# Patient Record
Sex: Male | Born: 1940
Health system: Southern US, Community
[De-identification: ages and names within clinical notes are randomized; demographics above are authoritative.]

## PROBLEM LIST (undated history)

## (undated) DIAGNOSIS — Z974 Presence of external hearing-aid: Secondary | ICD-10-CM

## (undated) DIAGNOSIS — N200 Calculus of kidney: Secondary | ICD-10-CM

## (undated) DIAGNOSIS — Z66 Do not resuscitate: Secondary | ICD-10-CM

## (undated) DIAGNOSIS — M316 Other giant cell arteritis: Secondary | ICD-10-CM

## (undated) DIAGNOSIS — C4490 Unspecified malignant neoplasm of skin, unspecified: Secondary | ICD-10-CM

## (undated) DIAGNOSIS — M1990 Unspecified osteoarthritis, unspecified site: Secondary | ICD-10-CM

## (undated) DIAGNOSIS — I6529 Occlusion and stenosis of unspecified carotid artery: Secondary | ICD-10-CM

## (undated) DIAGNOSIS — R972 Elevated prostate specific antigen [PSA]: Secondary | ICD-10-CM

## (undated) DIAGNOSIS — D075 Carcinoma in situ of prostate: Secondary | ICD-10-CM

## (undated) HISTORY — DX: Unspecified osteoarthritis, unspecified site: M19.90

## (undated) HISTORY — DX: Do not resuscitate: Z66

## (undated) HISTORY — PX: MOHS SURGERY: SUR867

## (undated) HISTORY — DX: Carcinoma in situ of prostate: D07.5

## (undated) HISTORY — DX: Unspecified malignant neoplasm of skin, unspecified: C44.90

## (undated) HISTORY — DX: Other giant cell arteritis: M31.6

## (undated) HISTORY — PX: PROSTATE BIOPSY: SHX241

## (undated) HISTORY — PX: KIDNEY SURGERY: SHX687

## (undated) HISTORY — PX: CYSTOSCOPY: SUR368

## (undated) HISTORY — DX: Occlusion and stenosis of unspecified carotid artery: I65.29

## (undated) HISTORY — DX: Elevated prostate specific antigen (PSA): R97.20

---

## 2007-09-04 ENCOUNTER — Ambulatory Visit: Payer: Self-pay | Admitting: Urology

## 2008-11-24 ENCOUNTER — Ambulatory Visit: Payer: Self-pay | Admitting: Urology

## 2011-01-18 ENCOUNTER — Ambulatory Visit: Payer: Self-pay | Admitting: Urology

## 2012-03-26 ENCOUNTER — Ambulatory Visit: Payer: Self-pay | Admitting: Urology

## 2012-03-26 DIAGNOSIS — R972 Elevated prostate specific antigen [PSA]: Secondary | ICD-10-CM | POA: Insufficient documentation

## 2012-04-03 ENCOUNTER — Ambulatory Visit: Payer: Self-pay | Admitting: Urology

## 2013-07-29 ENCOUNTER — Ambulatory Visit: Payer: Self-pay | Admitting: Urology

## 2013-09-19 IMAGING — CT CT ABD-PELV W/ CM
1 of 3 series · 13 of 32 positions shown, 19 images · non-contrast
Comparison: none

REASON FOR EXAM: Hematuria Nephrolithiasis
COMMENTS:

PROCEDURE:     CT  - CT ABDOMEN / PELVIS  W  - April 03, 2012  [DATE]
RESULT:
TECHNIQUE: Helical 3 mm sections were obtained from the lung bases through
the pubic symphysis status post intravenous administration of 85 mL
[id number]. Also included with this study are delayed images.

[Series 2: 3mm soft tissue · axial · 0.68mm/px · z∈[-598,-194]mm · 13 of 157 slices shown, 19 images]
[im 11/157  soft-tissue]
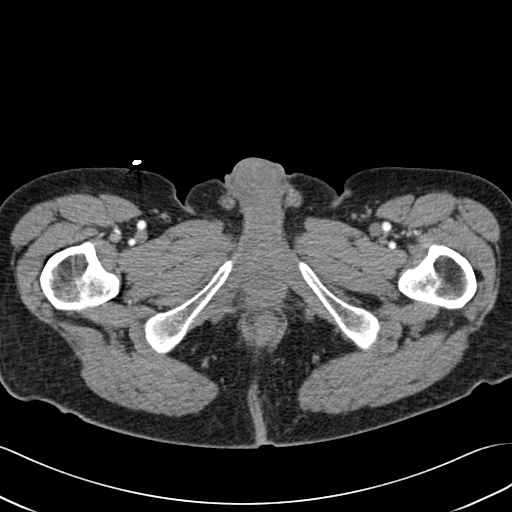
[im 11/157  bone]
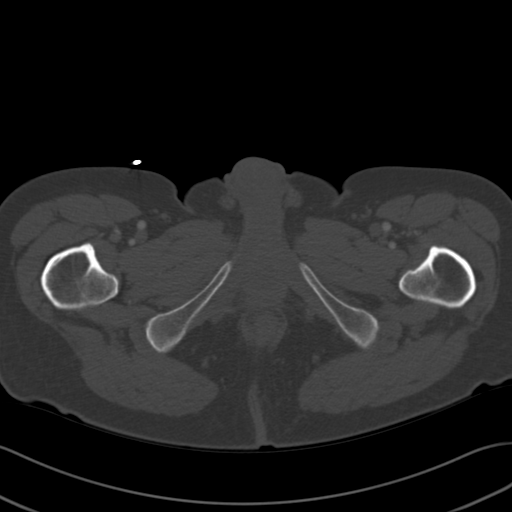
[im 21/157  soft-tissue]
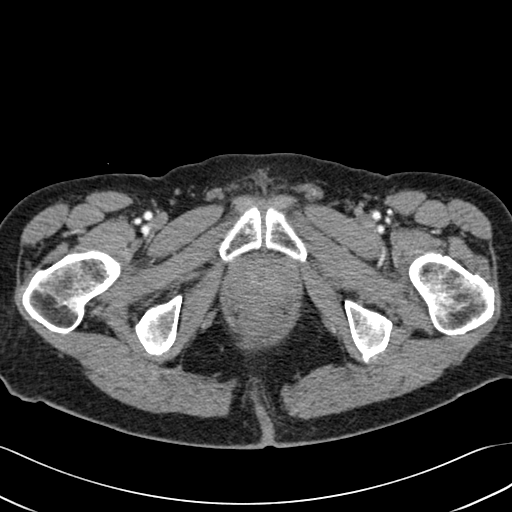
[im 32/157  soft-tissue]
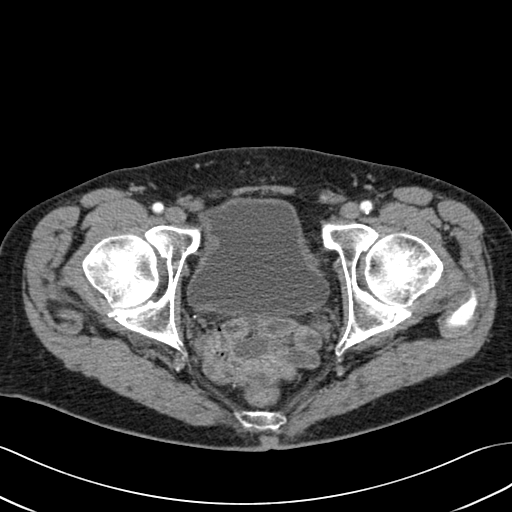
[im 42/157  soft-tissue]
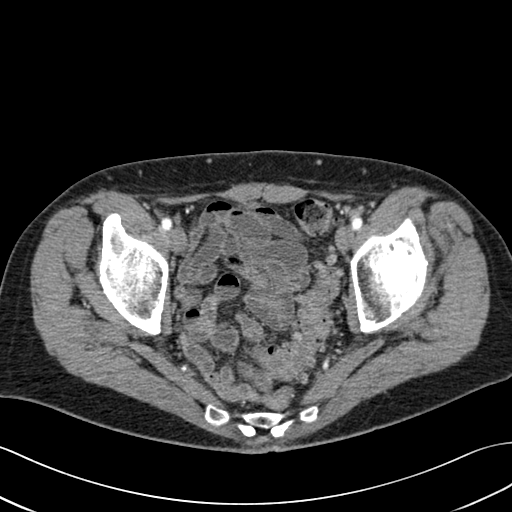
[im 53/157  soft-tissue]
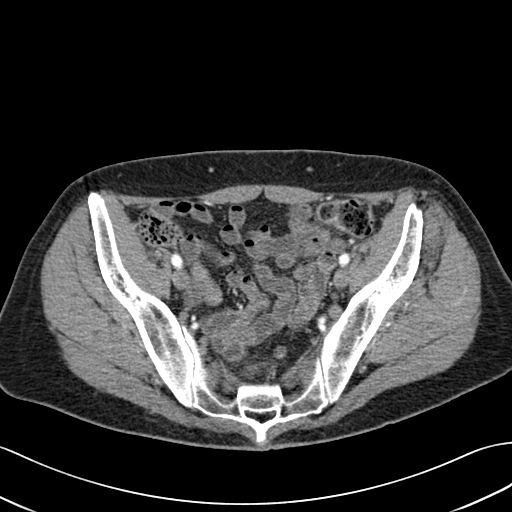
[im 63/157  soft-tissue]
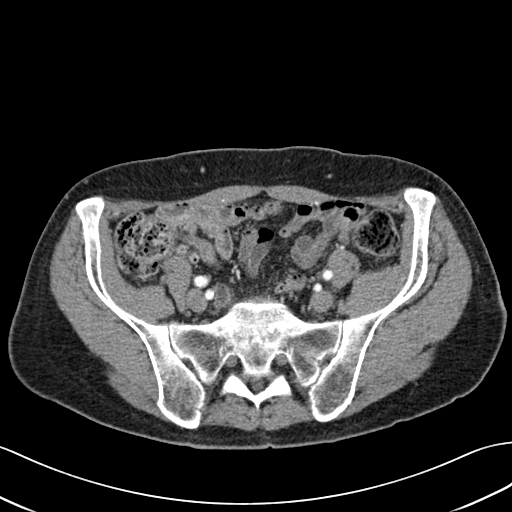
[im 84/157  soft-tissue]
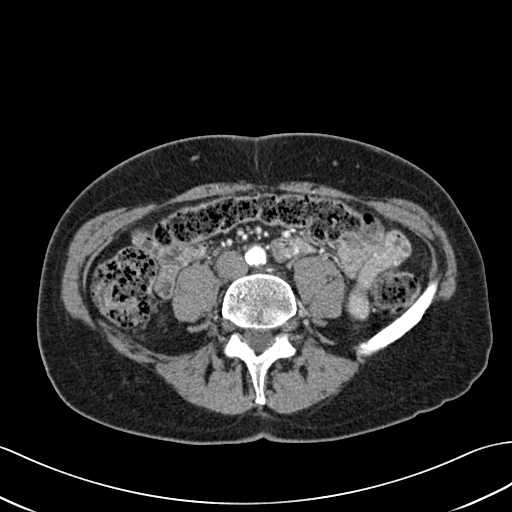
[im 94/157  soft-tissue]
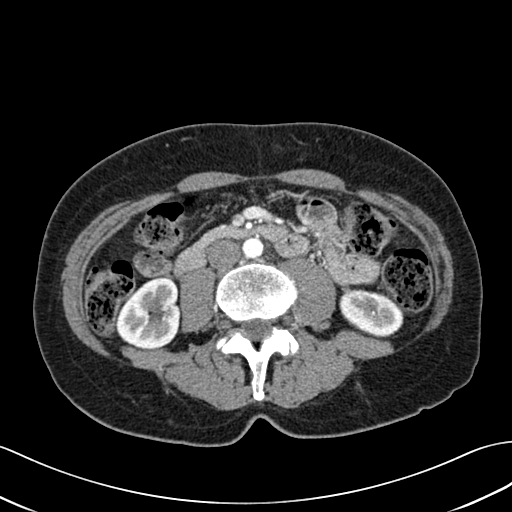
[im 105/157  soft-tissue]
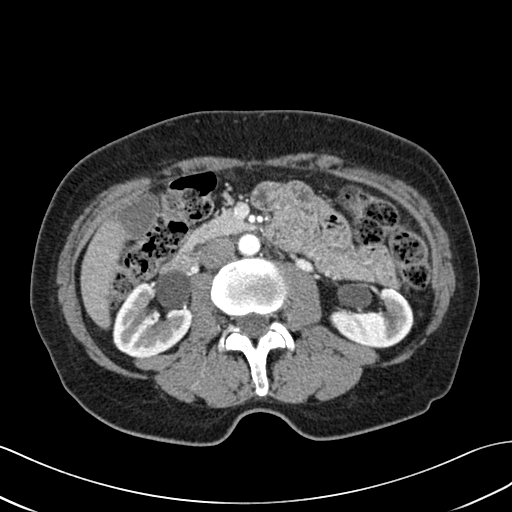
[im 105/157  bone]
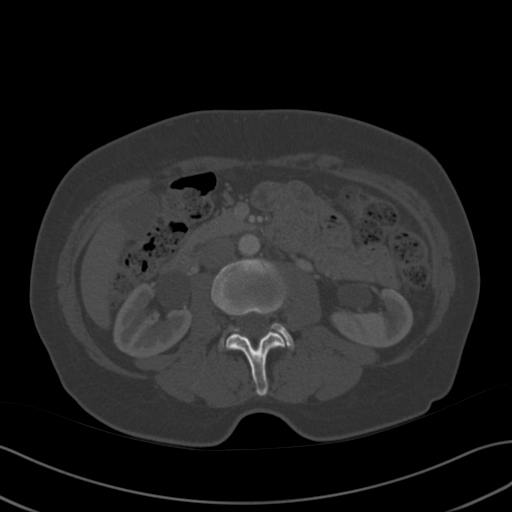
[im 115/157  soft-tissue]
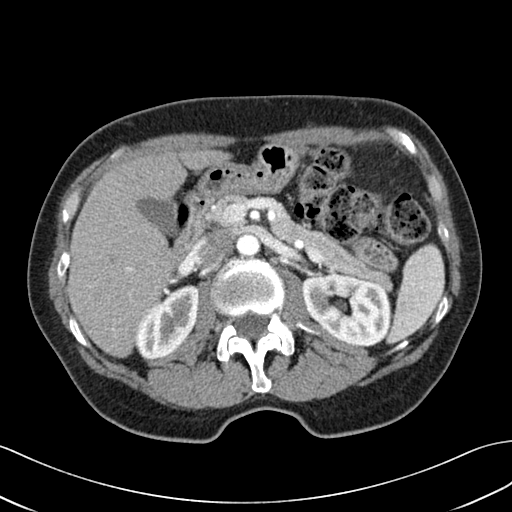
[im 115/157  lung]
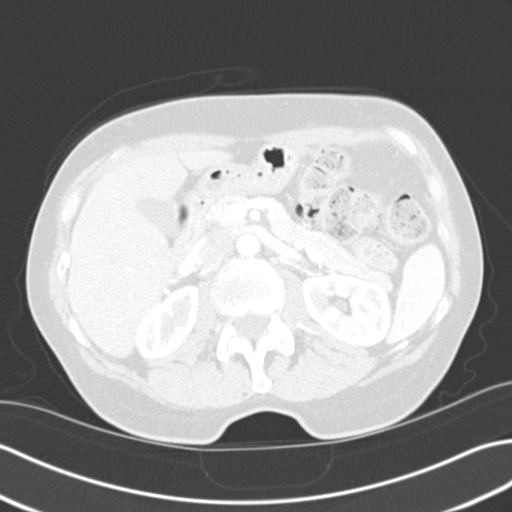
[im 125/157  soft-tissue]
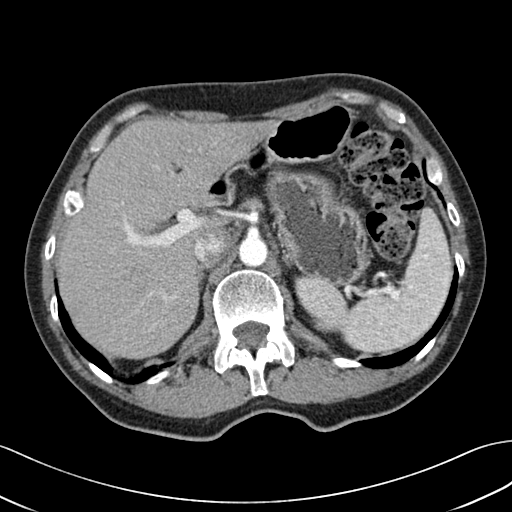
[im 125/157  lung]
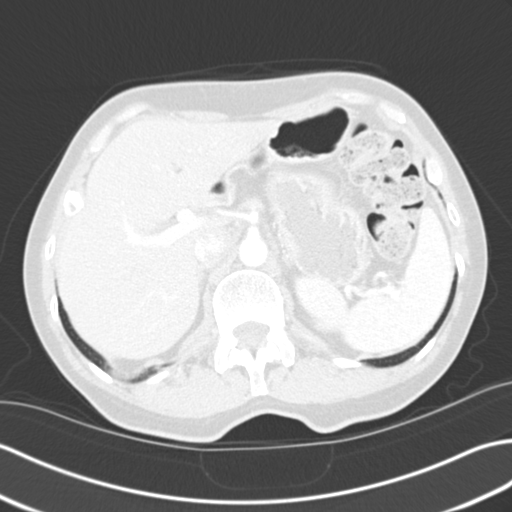
[im 136/157  soft-tissue]
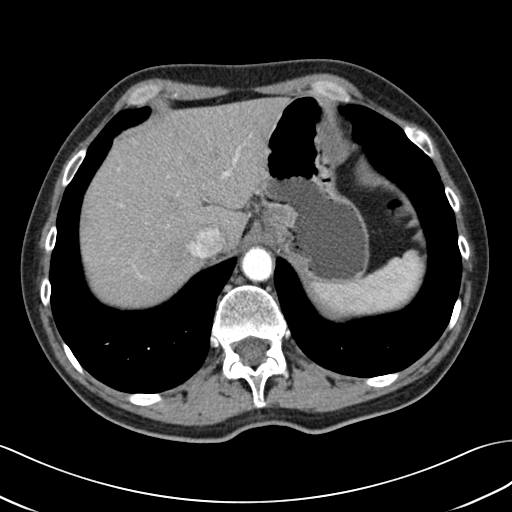
[im 136/157  lung]
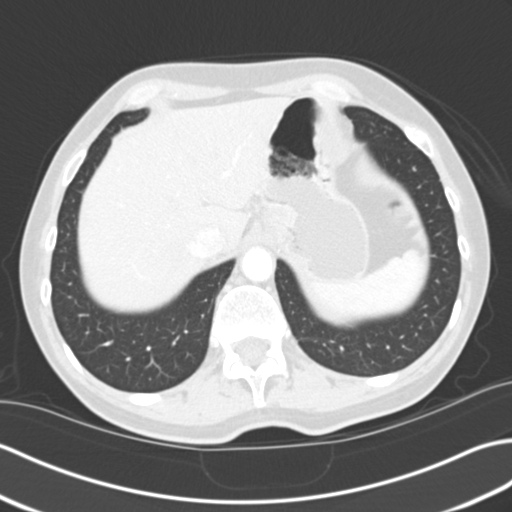
[im 146/157  soft-tissue]
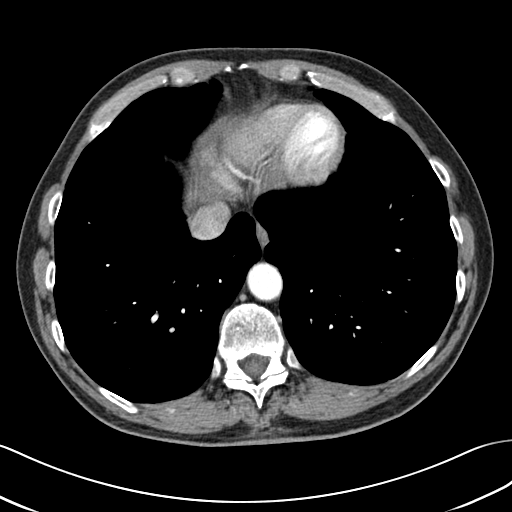
[im 146/157  lung]
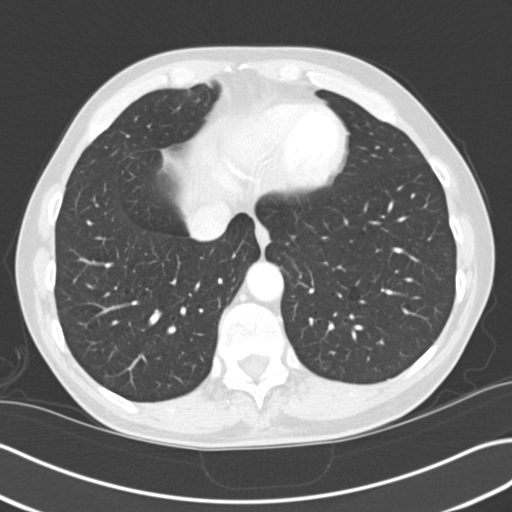

[13 of 32 positions shown; findings below may reference images not displayed]

FINDINGS: The lung bases are unremarkable.

The liver, spleen, adrenals, and pancreas are unremarkable.

Bilateral extrarenal pelves are identified. Nonobstructing calculi are
appreciated within the lower pole of the left kidney measuring 4.2 and
mm. There is otherwise no evidence of renal masses, hydronephrosis, nor
hydroureter.

There is no evidence of bowel obstruction, enteritis, colitis,
diverticulitis, nor appendicitis. There is diverticulosis within the sigmoid
colon.

There is no evidence of an abdominal aortic aneurysm. The celiac, SMA, IMA,
portal vein, and SMV are opacified. A moderate amount of fecal retention is
appreciated within the colon.
IMPRESSION: 1. Calculi within the left kidney, otherwise unremarkable. An incidental
note is made of bilateral extrarenal pelves, otherwise unremarkable CT.
2. Diverticulosis without CT evidence of diverticulitis.

## 2014-01-27 DIAGNOSIS — I8390 Asymptomatic varicose veins of unspecified lower extremity: Secondary | ICD-10-CM | POA: Insufficient documentation

## 2014-01-27 DIAGNOSIS — H9110 Presbycusis, unspecified ear: Secondary | ICD-10-CM | POA: Insufficient documentation

## 2014-01-27 DIAGNOSIS — L409 Psoriasis, unspecified: Secondary | ICD-10-CM | POA: Insufficient documentation

## 2014-02-23 ENCOUNTER — Ambulatory Visit: Payer: Self-pay | Admitting: Family Medicine

## 2015-01-14 IMAGING — CR DG ABDOMEN 1V
1 series · 2 of 2 positions shown · non-contrast
Comparison: Noncontrast CT scan of the abdomen dated April 03, 2012

CLINICAL DATA: Bilateral abdominal pain, history of left-sided
urinary tract stones

EXAM:
ABDOMEN - 1 VIEW

[Series 1: supine kub · 0.17mm/px · 2 of 2 slices shown]
[im 1/2]
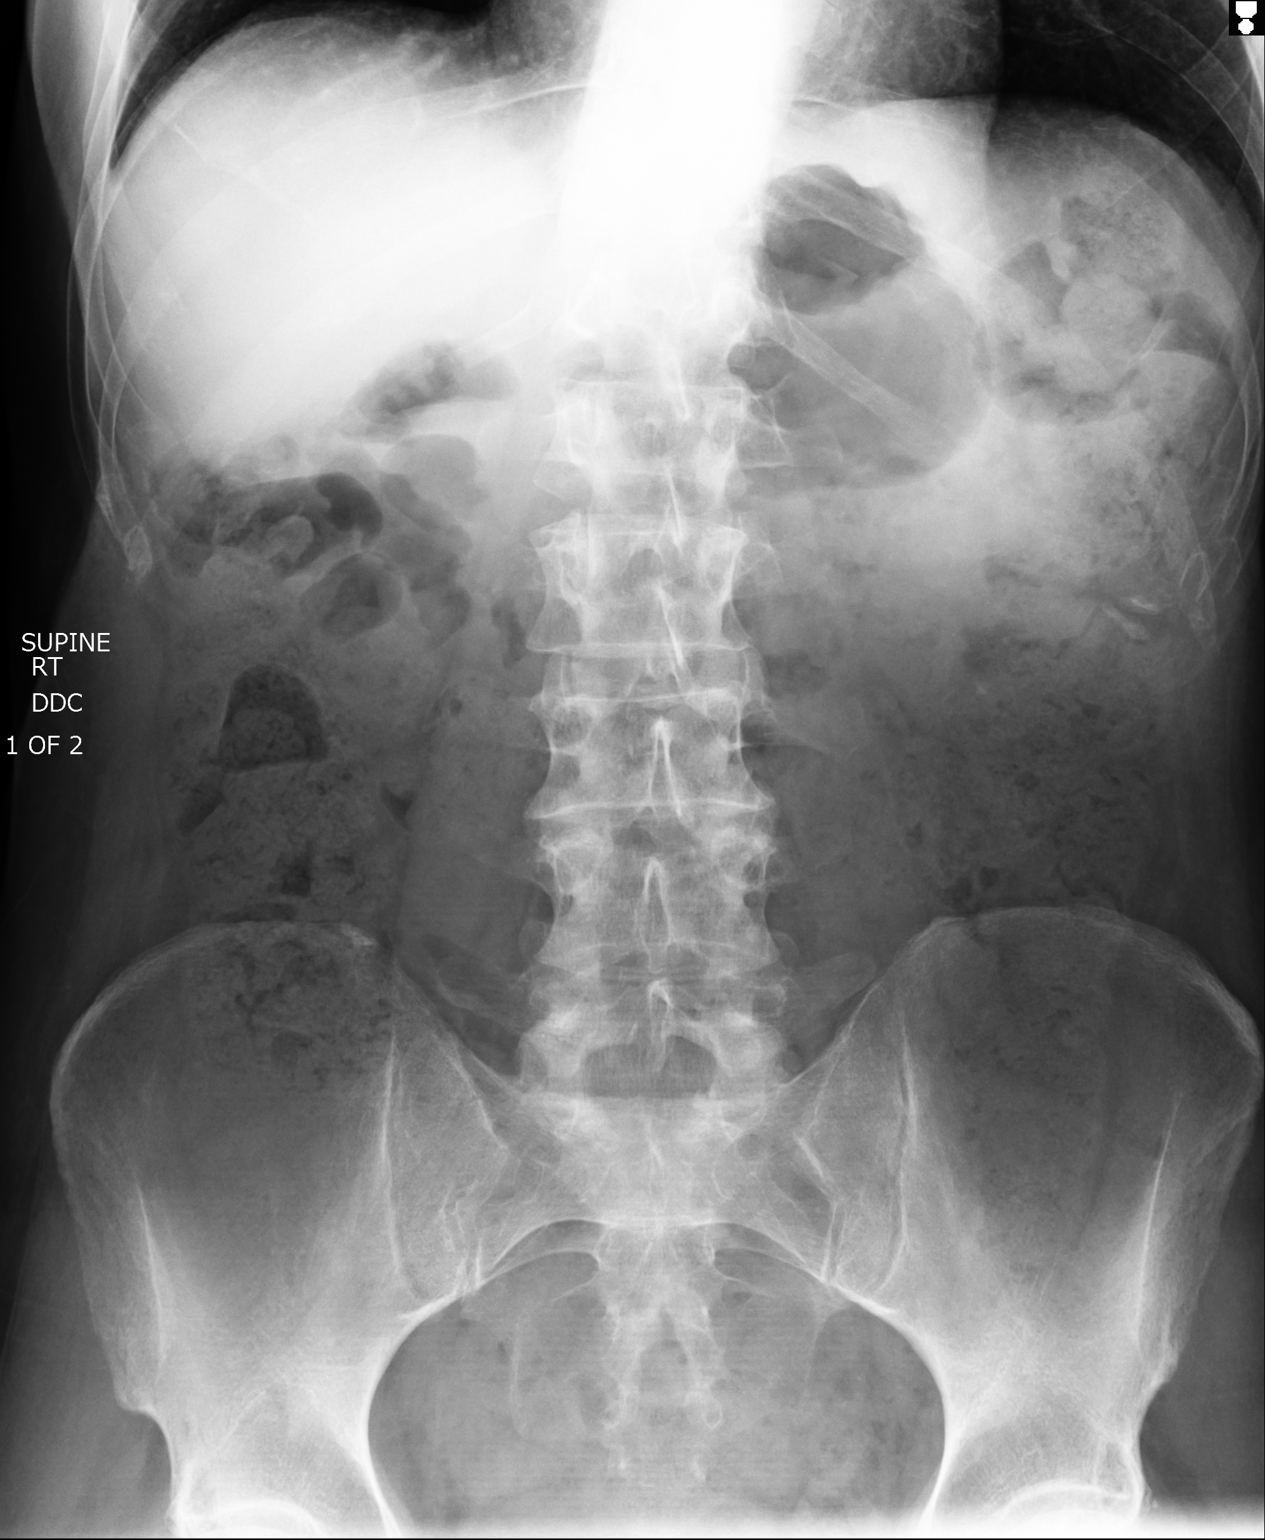
[im 2/2]
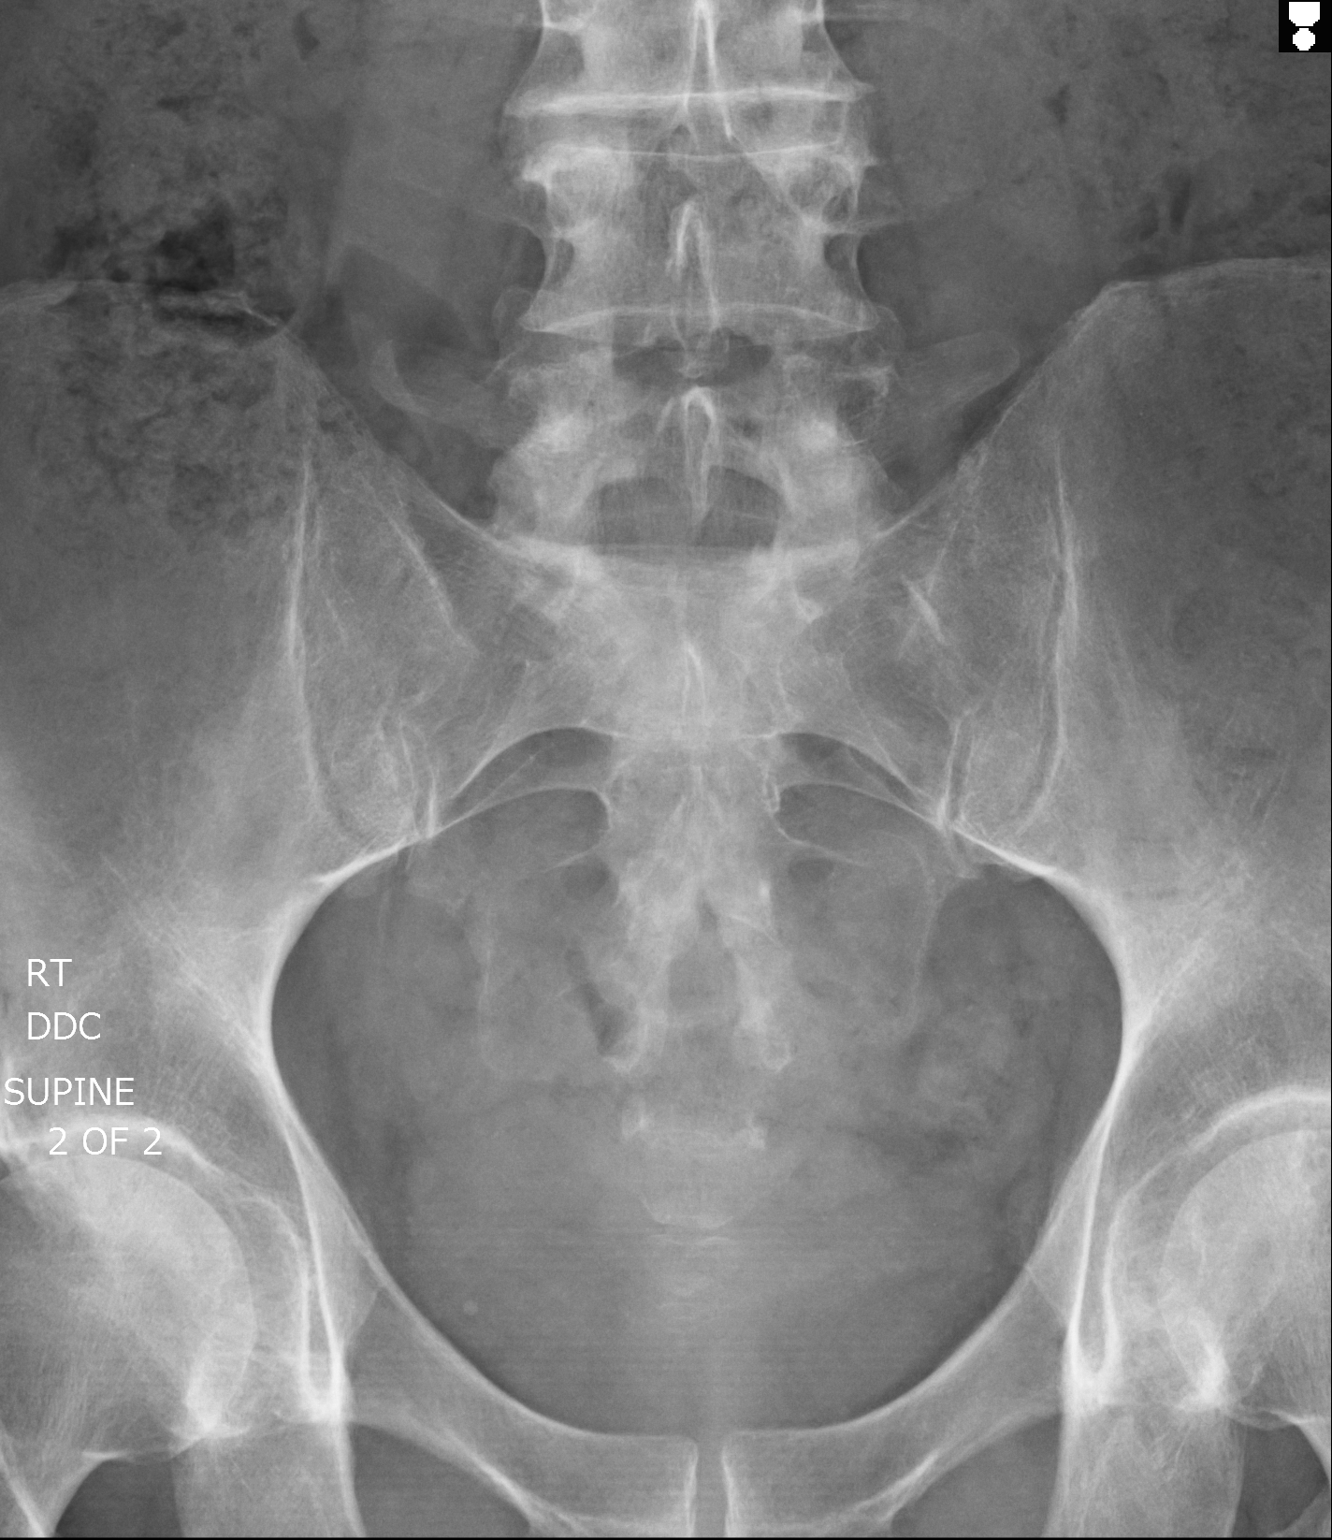

[2 of 2 positions shown; findings below may reference images not displayed]

FINDINGS: The bowel gas pattern suggests constipation. There are 2
calcifications under visible over the lower pole of the left kidney
1 measures 2 mm in diameter and the second measures 3 mm in
diameter. No definite stones are noted elsewhere on the left nor on
the right. Along the expected course of the ureters no stones are
evident. A pelvic calcification on the right is consistent with a
phlebolith. The bowel gas pattern suggests constipation. The bony
structures exhibit no acute abnormalities.
IMPRESSION: 1. There are 2 small calcifications projecting over the lower pole
of the left kidney consistent with stones
2. The bowel gas pattern is consistent with constipation.

## 2015-02-14 ENCOUNTER — Other Ambulatory Visit: Payer: Self-pay | Admitting: Ophthalmology

## 2015-02-14 ENCOUNTER — Other Ambulatory Visit
Admission: RE | Admit: 2015-02-14 | Discharge: 2015-02-14 | Disposition: A | Discharge status: Home / Self Care | Payer: PPO | Source: Ambulatory Visit | Attending: Ophthalmology | Admitting: Ophthalmology

## 2015-02-14 DIAGNOSIS — M316 Other giant cell arteritis: Secondary | ICD-10-CM | POA: Diagnosis present

## 2015-02-14 DIAGNOSIS — G453 Amaurosis fugax: Secondary | ICD-10-CM

## 2015-02-14 LAB — CBC WITH DIFFERENTIAL/PLATELET
Basophils Absolute: 0 10*3/uL (ref 0–0.1)
Basophils Relative: 0 %
EOS PCT: 1 %
Eosinophils Absolute: 0.1 10*3/uL (ref 0–0.7)
HEMATOCRIT: 38.9 % — AB (ref 40.0–52.0)
Hemoglobin: 13.1 g/dL (ref 13.0–18.0)
LYMPHS PCT: 10 %
Lymphs Abs: 0.8 10*3/uL — ABNORMAL LOW (ref 1.0–3.6)
MCH: 31.4 pg (ref 26.0–34.0)
MCHC: 33.7 g/dL (ref 32.0–36.0)
MCV: 93.3 fL (ref 80.0–100.0)
MONOS PCT: 6 %
Monocytes Absolute: 0.5 10*3/uL (ref 0.2–1.0)
Neutro Abs: 6.4 10*3/uL (ref 1.4–6.5)
Neutrophils Relative %: 83 %
Platelets: 335 10*3/uL (ref 150–440)
RBC: 4.17 MIL/uL — ABNORMAL LOW (ref 4.40–5.90)
RDW: 12.2 % (ref 11.5–14.5)
WBC: 7.8 10*3/uL (ref 3.8–10.6)

## 2015-02-14 LAB — SEDIMENTATION RATE: SED RATE: 45 mm/h — AB (ref 0–20)

## 2015-02-14 LAB — C-REACTIVE PROTEIN: CRP: 1.6 mg/dL — AB (ref <1.0)

## 2015-02-17 ENCOUNTER — Encounter
Admission: RE | Admit: 2015-02-17 | Discharge: 2015-02-17 | Disposition: A | Discharge status: Home / Self Care | Payer: PPO | Source: Ambulatory Visit | Attending: Vascular Surgery | Admitting: Vascular Surgery

## 2015-02-17 ENCOUNTER — Ambulatory Visit: Payer: PPO

## 2015-02-17 DIAGNOSIS — Z7982 Long term (current) use of aspirin: Secondary | ICD-10-CM | POA: Diagnosis not present

## 2015-02-17 DIAGNOSIS — R51 Headache: Secondary | ICD-10-CM | POA: Diagnosis present

## 2015-02-17 DIAGNOSIS — Z87891 Personal history of nicotine dependence: Secondary | ICD-10-CM | POA: Diagnosis not present

## 2015-02-17 DIAGNOSIS — Z87442 Personal history of urinary calculi: Secondary | ICD-10-CM | POA: Diagnosis not present

## 2015-02-17 DIAGNOSIS — Z881 Allergy status to other antibiotic agents status: Secondary | ICD-10-CM | POA: Diagnosis not present

## 2015-02-17 DIAGNOSIS — Z79899 Other long term (current) drug therapy: Secondary | ICD-10-CM | POA: Diagnosis not present

## 2015-02-17 DIAGNOSIS — Z8249 Family history of ischemic heart disease and other diseases of the circulatory system: Secondary | ICD-10-CM | POA: Diagnosis not present

## 2015-02-17 DIAGNOSIS — R972 Elevated prostate specific antigen [PSA]: Secondary | ICD-10-CM | POA: Diagnosis not present

## 2015-02-17 DIAGNOSIS — M316 Other giant cell arteritis: Secondary | ICD-10-CM | POA: Diagnosis not present

## 2015-02-17 HISTORY — DX: Calculus of kidney: N20.0

## 2015-02-17 LAB — CBC
HEMATOCRIT: 37.9 % — AB (ref 40.0–52.0)
Hemoglobin: 12.8 g/dL — ABNORMAL LOW (ref 13.0–18.0)
MCH: 31.5 pg (ref 26.0–34.0)
MCHC: 33.8 g/dL (ref 32.0–36.0)
MCV: 93.1 fL (ref 80.0–100.0)
Platelets: 318 10*3/uL (ref 150–440)
RBC: 4.07 MIL/uL — ABNORMAL LOW (ref 4.40–5.90)
RDW: 12.2 % (ref 11.5–14.5)
WBC: 10.7 10*3/uL — AB (ref 3.8–10.6)

## 2015-02-17 LAB — BASIC METABOLIC PANEL
Anion gap: 8 (ref 5–15)
BUN: 22 mg/dL — ABNORMAL HIGH (ref 6–20)
CO2: 27 mmol/L (ref 22–32)
CREATININE: 1.02 mg/dL (ref 0.61–1.24)
Calcium: 9.5 mg/dL (ref 8.9–10.3)
Chloride: 104 mmol/L (ref 101–111)
Glucose, Bld: 107 mg/dL — ABNORMAL HIGH (ref 65–99)
Potassium: 4 mmol/L (ref 3.5–5.1)
Sodium: 139 mmol/L (ref 135–145)

## 2015-02-17 LAB — PROTIME-INR
INR: 0.99
PROTHROMBIN TIME: 13.3 s (ref 11.4–15.0)

## 2015-02-17 LAB — APTT: aPTT: 28 seconds (ref 24–36)

## 2015-02-17 NOTE — Patient Instructions (Signed)
  Your procedure is scheduled on: 02/18/15 @ 8:45 am Report to Day Surgery. .  Remember: Instructions that are not followed completely may result in serious medical risk, up to and including death, or upon the discretion of your surgeon and anesthesiologist your surgery may need to be rescheduled.    __x__ 1. Do not eat food or drink liquids after midnight. No gum chewing or hard candies.     ____ 2. No Alcohol for 24 hours before or after surgery.   ____ 3. Bring all medications with you on the day of surgery if instructed.    __x__ 4. Notify your doctor if there is any change in your medical condition     (cold, fever, infections).     Do not wear jewelry, make-up, hairpins, clips or nail polish.  Do not wear lotions, powders, or perfumes. You may wear deodorant.  Do not shave 48 hours prior to surgery. Men may shave face and neck.  Do not bring valuables to the hospital.    [facility] is not responsible for any belongings or valuables.               Contacts, dentures or bridgework may not be worn into surgery.  Leave your suitcase in the car. After surgery it may be brought to your room.  For patients admitted to the hospital, discharge time is determined by your                treatment team.   Patients discharged the day of surgery will not be allowed to drive home.   Please read over the following fact sheets that you were given:      _x_ Take these medicines the morning of surgery with A SIP OF WATER:    1. predniSONE (STERAPRED UNI-PAK 48 TAB) 10 MG (48) TBPK tablet  2.   3.   4.  5.  6.  ____ Fleet Enema (as directed)   ____ Use CHG Soap as directed  ____ Use inhalers on the day of surgery  ____ Stop metformin 2 days prior to surgery    ____ Take 1/2 of usual insulin dose the night before surgery and none on the morning of surgery.   ____ Stop Coumadin/Plavix/aspirin on   ____ Stop Anti-inflammatories on    ____ Stop supplements until after surgery.     ____ Bring C-Pap to the hospital.

## 2015-02-18 ENCOUNTER — Encounter: Payer: Self-pay | Admitting: *Deleted

## 2015-02-18 ENCOUNTER — Ambulatory Visit: Payer: PPO | Admitting: Anesthesiology

## 2015-02-18 ENCOUNTER — Encounter
Admission: RE | Disposition: A | Discharge status: Home / Self Care | Payer: Self-pay | Source: Ambulatory Visit | Attending: Vascular Surgery

## 2015-02-18 ENCOUNTER — Ambulatory Visit
Admission: RE | Admit: 2015-02-18 | Discharge: 2015-02-18 | Disposition: A | Discharge status: Home / Self Care | Payer: PPO | Source: Ambulatory Visit | Attending: Vascular Surgery | Admitting: Vascular Surgery

## 2015-02-18 DIAGNOSIS — M316 Other giant cell arteritis: Secondary | ICD-10-CM | POA: Insufficient documentation

## 2015-02-18 DIAGNOSIS — Z7982 Long term (current) use of aspirin: Secondary | ICD-10-CM | POA: Insufficient documentation

## 2015-02-18 DIAGNOSIS — R972 Elevated prostate specific antigen [PSA]: Secondary | ICD-10-CM | POA: Insufficient documentation

## 2015-02-18 DIAGNOSIS — Z881 Allergy status to other antibiotic agents status: Secondary | ICD-10-CM | POA: Insufficient documentation

## 2015-02-18 DIAGNOSIS — Z8249 Family history of ischemic heart disease and other diseases of the circulatory system: Secondary | ICD-10-CM | POA: Insufficient documentation

## 2015-02-18 DIAGNOSIS — Z87891 Personal history of nicotine dependence: Secondary | ICD-10-CM | POA: Insufficient documentation

## 2015-02-18 DIAGNOSIS — R51 Headache: Secondary | ICD-10-CM | POA: Insufficient documentation

## 2015-02-18 DIAGNOSIS — Z79899 Other long term (current) drug therapy: Secondary | ICD-10-CM | POA: Insufficient documentation

## 2015-02-18 DIAGNOSIS — Z87442 Personal history of urinary calculi: Secondary | ICD-10-CM | POA: Insufficient documentation

## 2015-02-18 HISTORY — PX: ARTERY BIOPSY: SHX891

## 2015-02-18 SURGERY — BIOPSY TEMPORAL ARTERY
Anesthesia: Monitor Anesthesia Care | Laterality: Left | Wound class: Clean

## 2015-02-18 MED ORDER — LIDOCAINE-EPINEPHRINE 1 %-1:100000 IJ SOLN
INTRAMUSCULAR | Status: DC | PRN
  Administered 2015-02-18: 6 mL

## 2015-02-18 MED ORDER — LACTATED RINGERS IV SOLN
INTRAVENOUS | Status: DC
  Administered 2015-02-18: 07:00:00 via INTRAVENOUS

## 2015-02-18 MED ORDER — MIDAZOLAM HCL 2 MG/2ML IJ SOLN
INTRAMUSCULAR | Status: DC | PRN
Start: 2015-02-18 — End: 2015-02-18
  Administered 2015-02-18: 2 mg via INTRAVENOUS

## 2015-02-18 MED ORDER — LIDOCAINE-EPINEPHRINE 1 %-1:100000 IJ SOLN
INTRAMUSCULAR | Status: AC
  Filled 2015-02-18: qty 1

## 2015-02-18 MED ORDER — CEFAZOLIN SODIUM 1-5 GM-% IV SOLN
INTRAVENOUS | Status: AC
  Filled 2015-02-18: qty 50

## 2015-02-18 MED ORDER — CEFAZOLIN SODIUM 1-5 GM-% IV SOLN
1.0000 g | Freq: Once | INTRAVENOUS | Status: AC
  Administered 2015-02-18: 1 g via INTRAVENOUS

## 2015-02-18 MED ORDER — FAMOTIDINE 20 MG PO TABS
20.0000 mg | ORAL_TABLET | Freq: Once | ORAL | Status: AC
  Administered 2015-02-18: 20 mg via ORAL

## 2015-02-18 MED ORDER — FAMOTIDINE 20 MG PO TABS
ORAL_TABLET | ORAL | Status: AC
  Administered 2015-02-18: 20 mg via ORAL
  Filled 2015-02-18: qty 1

## 2015-02-18 MED ORDER — FENTANYL CITRATE (PF) 100 MCG/2ML IJ SOLN
INTRAMUSCULAR | Status: DC | PRN
  Administered 2015-02-18: 50 ug via INTRAVENOUS
  Administered 2015-02-18 (×2): 25 ug via INTRAVENOUS

## 2015-02-18 SURGICAL SUPPLY — 29 items
BLADE SURG 15 STRL LF DISP TIS (BLADE) ×1 IMPLANT
BLADE SURG 15 STRL SS (BLADE) ×2
BLADE SURG SZ11 CARB STEEL (BLADE) ×3 IMPLANT
CNTNR SPEC 2.5X3XGRAD LEK (MISCELLANEOUS) ×1
CONT SPEC 4OZ STER OR WHT (MISCELLANEOUS) ×2
CONTAINER SPEC 2.5X3XGRAD LEK (MISCELLANEOUS) ×1 IMPLANT
COTTON BALL STRL MEDIUM (GAUZE/BANDAGES/DRESSINGS) ×3 IMPLANT
DRAPE PED LAPAROTOMY (DRAPES) ×3 IMPLANT
ELECT CAUTERY BLADE 6.4 (BLADE) ×3 IMPLANT
GLOVE BIO SURGEON STRL SZ7 (GLOVE) ×6 IMPLANT
GOWN L4 XLG 20 PK N/S (GOWN DISPOSABLE) ×3 IMPLANT
GOWN STRL REUS W/ TWL LRG LVL3 (GOWN DISPOSABLE) ×1 IMPLANT
GOWN STRL REUS W/TWL LRG LVL3 (GOWN DISPOSABLE) ×2
KIT RM TURNOVER STRD PROC AR (KITS) ×3 IMPLANT
LABEL OR SOLS (LABEL) ×3 IMPLANT
LIQUID BAND (GAUZE/BANDAGES/DRESSINGS) ×3 IMPLANT
NDL SAFETY 25GX1.5 (NEEDLE) ×3 IMPLANT
NS IRRIG 500ML POUR BTL (IV SOLUTION) ×3 IMPLANT
PACK BASIN MINOR ARMC (MISCELLANEOUS) ×3 IMPLANT
PAD GROUND ADULT SPLIT (MISCELLANEOUS) ×3 IMPLANT
SOL PREP PVP 2OZ (MISCELLANEOUS) ×3
SOLUTION PREP PVP 2OZ (MISCELLANEOUS) ×1 IMPLANT
SUT MNCRL AB 4-0 PS2 18 (SUTURE) ×3 IMPLANT
SUT SILK 4 0 (SUTURE)
SUT SILK 4-0 18XBRD TIE 12 (SUTURE) IMPLANT
SUT VIC AB 3-0 SH 27 (SUTURE) ×2
SUT VIC AB 3-0 SH 27X BRD (SUTURE) ×1 IMPLANT
SYR BULB IRRIG 60ML STRL (SYRINGE) IMPLANT
SYRINGE 10CC LL (SYRINGE) ×3 IMPLANT

## 2015-02-18 NOTE — Transfer of Care (Signed)
Immediate Anesthesia Transfer of Care Note  Patient: Jackson Coffey  Procedure(s) Performed: Procedure(s): BIOPSY TEMPORAL ARTERY (Left)  Patient Location: PACU  Anesthesia Type:MAC  Level of Consciousness: awake, alert  and oriented  Airway & Oxygen Therapy: Patient Spontanous Breathing  Post-op Assessment: Report given to RN and Post -op Vital signs reviewed and stable  Post vital signs: Reviewed and stable  Last Vitals:  Filed Vitals:   02/18/15 0846  BP: 140/80  Pulse: 76  Temp: 36.2 C  Resp: 16    Complications: No apparent anesthesia complications

## 2015-02-18 NOTE — Anesthesia Preprocedure Evaluation (Signed)
Anesthesia Evaluation  Patient identified by MRN, date of birth, ID band Patient awake    Reviewed: Allergy & Precautions, H&P , NPO status , Patient's Chart, lab work & pertinent test results  Airway Mallampati: III  TM Distance: >3 FB Neck ROM: limited    Dental  (+) Poor Dentition, Missing, Upper Dentures, Lower Dentures   Pulmonary COPDformer smoker,  breath sounds clear to auscultation  + decreased breath sounds      Cardiovascular Exercise Tolerance: Good - Past MI negative cardio ROS Normal cardiovascular examRhythm:regular Rate:Normal     Neuro/Psych negative neurological ROS  negative psych ROS   GI/Hepatic negative GI ROS, Neg liver ROS,   Endo/Other  negative endocrine ROS  Renal/GU Renal disease  negative genitourinary   Musculoskeletal   Abdominal   Peds  Hematology negative hematology ROS (+)   Anesthesia Other Findings Past Medical History:   Elevated prostate specific antigen (PSA)                     Facial pain                                                  Visual changes                                               Kidney stones                                                Reproductive/Obstetrics negative OB ROS                             Anesthesia Physical Anesthesia Plan  ASA: II  Anesthesia Plan: MAC   Post-op Pain Management:    Induction:   Airway Management Planned:   Additional Equipment:   Intra-op Plan:   Post-operative Plan:   Informed Consent: I have reviewed the patients History and Physical, chart, labs and discussed the procedure including the risks, benefits and alternatives for the proposed anesthesia with the patient or authorized representative who has indicated his/her understanding and acceptance.   Dental Advisory Given  Plan Discussed with: Anesthesiologist, CRNA and Surgeon  Anesthesia Plan Comments:          Anesthesia Quick Evaluation

## 2015-02-18 NOTE — Op Note (Signed)
        OPERATIVE NOTE   PRE-OPERATIVE DIAGNOSIS: suspected temporal arteritis POST-OPERATIVE DIAGNOSIS: Same as above  PROCEDURE: 1.   left temporal artery biopsy  SURGEON: Kooper Chriswell, MD  ASSISTANT(S): none  ANESTHESIA: MAC  ESTIMATED BLOOD LOSS: Minimal  FINDING(S): 1.  none  SPECIMEN(S):  Left superficial temporal artery sent to pathology  INDICATIONS:   Patient is a 74 y.o. male who presents with headaches and clinical concern for temporal arteritis.We will consult by his primary care physician for consideration for temporal artery biopsy. Risks and benefits were discussed and he was agreeable to proceed.  DESCRIPTION: After obtaining full informed written consent, the patient was brought back to the operating room and placed supine upon the operating table.  The patient received IV antibiotics prior to induction.  After obtaining adequate anesthesia, the patient was prepped and draped in the standard fashion. The area in front of his left ear was anesthetized copiously with a solution of 1% lidocaine and half percent Marcaine without epinephrine. I then made an incision just in front of the left ear overlying the palpable pulse. I then dissected down through the subcutaneous tissues and identified the superficial temporal artery. This was dissected out over a several centimeters and branches were ligated and divided between silk ties. Care was used to avoid electrocautery around the artery. I then clamped the artery proximally and distally and transected the artery. The specimen was then sent to pathology. The proximal and distal artery were ligated with 3-0 silk ties. Hemostasis was achieved. The wound was then closed with a series of interrupted 3-0 Vicryl's and the skin was closed with a 4-0 Monocryl. Sterile dressing was placed. The patient was taken to the recovery room in stable condition having tolerated the procedure well.  COMPLICATIONS: None  CONDITION:  Stable   Shadiamond Koska 02/18/2015 8:58 AM   Cc:  Dr. George Ina

## 2015-02-18 NOTE — H&P (Signed)
[facility] History & Physical Update  The patient was interviewed and re-examined.  The patient's previous History and Physical has been reviewed and is unchanged.  There is no change in the plan of care. We plan to proceed with the scheduled procedure.  Calvin Chura, MD  02/18/2015, 7:30 AM

## 2015-02-18 NOTE — Anesthesia Postprocedure Evaluation (Signed)
  Anesthesia Post-op Note  Patient: Jackson Coffey  Procedure(s) Performed: Procedure(s): BIOPSY TEMPORAL ARTERY (Left)  Anesthesia type:MAC  Patient location: PACU  Post pain: Pain level controlled  Post assessment: Post-op Vital signs reviewed, Patient's Cardiovascular Status Stable, Respiratory Function Stable, Patent Airway and No signs of Nausea or vomiting  Post vital signs: Reviewed and stable  Last Vitals:  Filed Vitals:   02/18/15 0949  BP: 121/56  Pulse: 72  Temp:   Resp: 16    Level of consciousness: awake, alert  and patient cooperative  Complications: No apparent anesthesia complications

## 2015-02-18 NOTE — Discharge Instructions (Signed)

## 2015-02-21 LAB — SURGICAL PATHOLOGY

## 2015-02-22 ENCOUNTER — Encounter: Payer: Self-pay | Admitting: Family Medicine

## 2015-04-16 DIAGNOSIS — R339 Retention of urine, unspecified: Secondary | ICD-10-CM | POA: Insufficient documentation

## 2015-07-27 DIAGNOSIS — D075 Carcinoma in situ of prostate: Secondary | ICD-10-CM | POA: Diagnosis not present

## 2015-08-10 DIAGNOSIS — M316 Other giant cell arteritis: Secondary | ICD-10-CM | POA: Diagnosis not present

## 2015-09-07 DIAGNOSIS — R51 Headache: Secondary | ICD-10-CM | POA: Diagnosis not present

## 2015-09-07 DIAGNOSIS — M316 Other giant cell arteritis: Secondary | ICD-10-CM | POA: Diagnosis not present

## 2015-09-07 DIAGNOSIS — I6523 Occlusion and stenosis of bilateral carotid arteries: Secondary | ICD-10-CM | POA: Diagnosis not present

## 2015-09-28 DIAGNOSIS — H25813 Combined forms of age-related cataract, bilateral: Secondary | ICD-10-CM | POA: Diagnosis not present

## 2015-10-12 DIAGNOSIS — M316 Other giant cell arteritis: Secondary | ICD-10-CM | POA: Diagnosis not present

## 2015-11-09 DIAGNOSIS — M316 Other giant cell arteritis: Secondary | ICD-10-CM | POA: Diagnosis not present

## 2015-12-14 DIAGNOSIS — M1990 Unspecified osteoarthritis, unspecified site: Secondary | ICD-10-CM | POA: Diagnosis not present

## 2015-12-14 DIAGNOSIS — M316 Other giant cell arteritis: Secondary | ICD-10-CM | POA: Diagnosis not present

## 2015-12-21 ENCOUNTER — Ambulatory Visit: Payer: Self-pay | Admitting: Family Medicine

## 2015-12-28 ENCOUNTER — Other Ambulatory Visit: Payer: Self-pay

## 2015-12-28 ENCOUNTER — Ambulatory Visit (INDEPENDENT_AMBULATORY_CARE_PROVIDER_SITE_OTHER): Payer: PPO | Admitting: Family Medicine

## 2015-12-28 ENCOUNTER — Encounter: Payer: Self-pay | Admitting: Family Medicine

## 2015-12-28 VITALS — BP 120/62 | HR 76 | Temp 97.8°F | Resp 14 | Wt 140.0 lb

## 2015-12-28 DIAGNOSIS — R972 Elevated prostate specific antigen [PSA]: Secondary | ICD-10-CM | POA: Diagnosis not present

## 2015-12-28 DIAGNOSIS — I6523 Occlusion and stenosis of bilateral carotid arteries: Secondary | ICD-10-CM | POA: Diagnosis not present

## 2015-12-28 DIAGNOSIS — R634 Abnormal weight loss: Secondary | ICD-10-CM

## 2015-12-28 DIAGNOSIS — M316 Other giant cell arteritis: Secondary | ICD-10-CM

## 2015-12-28 DIAGNOSIS — D075 Carcinoma in situ of prostate: Secondary | ICD-10-CM | POA: Diagnosis not present

## 2015-12-28 DIAGNOSIS — I6529 Occlusion and stenosis of unspecified carotid artery: Secondary | ICD-10-CM | POA: Diagnosis not present

## 2015-12-28 HISTORY — DX: Occlusion and stenosis of unspecified carotid artery: I65.29

## 2015-12-28 HISTORY — DX: Other giant cell arteritis: M31.6

## 2015-12-28 HISTORY — DX: Elevated prostate specific antigen (PSA): R97.20

## 2015-12-28 HISTORY — DX: Carcinoma in situ of prostate: D07.5

## 2015-12-28 LAB — LIPID PANEL
CHOL/HDL RATIO: 3.7 ratio (ref <=5.0)
Cholesterol: 161 mg/dL (ref 125–200)
HDL: 43 mg/dL (ref >=40)
LDL Cholesterol: 91 mg/dL (ref <130)
TRIGLYCERIDES: 134 mg/dL (ref <150)
VLDL: 27 mg/dL (ref <30)

## 2015-12-28 LAB — COMPLETE METABOLIC PANEL WITH GFR
ALT: 12 U/L (ref 9–46)
AST: 20 U/L (ref 10–35)
Albumin: 4 g/dL (ref 3.6–5.1)
Alkaline Phosphatase: 110 U/L (ref 40–115)
BUN: 15 mg/dL (ref 7–25)
CALCIUM: 9.6 mg/dL (ref 8.6–10.3)
CHLORIDE: 104 mmol/L (ref 98–110)
CO2: 32 mmol/L — AB (ref 20–31)
Creat: 0.95 mg/dL (ref 0.70–1.18)
GFR, EST NON AFRICAN AMERICAN: 78 mL/min (ref >=60)
Glucose, Bld: 84 mg/dL (ref 65–99)
Potassium: 4.8 mmol/L (ref 3.5–5.3)
Sodium: 143 mmol/L (ref 135–146)
Total Bilirubin: 0.5 mg/dL (ref 0.2–1.2)
Total Protein: 6.1 g/dL (ref 6.1–8.1)

## 2015-12-28 LAB — CBC WITH DIFFERENTIAL/PLATELET
BASOS PCT: 0 %
Basophils Absolute: 0 cells/uL (ref 0–200)
EOS ABS: 213 {cells}/uL (ref 15–500)
Eosinophils Relative: 3 %
HEMATOCRIT: 40.4 % (ref 38.5–50.0)
Hemoglobin: 13.6 g/dL (ref 13.2–17.1)
Lymphocytes Relative: 21 %
Lymphs Abs: 1491 cells/uL (ref 850–3900)
MCH: 32.1 pg (ref 27.0–33.0)
MCHC: 33.7 g/dL (ref 32.0–36.0)
MCV: 95.3 fL (ref 80.0–100.0)
MONO ABS: 568 {cells}/uL (ref 200–950)
MPV: 10.3 fL (ref 7.5–12.5)
Monocytes Relative: 8 %
NEUTROS ABS: 4828 {cells}/uL (ref 1500–7800)
Neutrophils Relative %: 68 %
Platelets: 272 10*3/uL (ref 140–400)
RBC: 4.24 MIL/uL (ref 4.20–5.80)
RDW: 12.4 % (ref 11.0–15.0)
WBC: 7.1 10*3/uL (ref 3.8–10.8)

## 2015-12-28 LAB — T4, FREE: Free T4: 1.2 ng/dL (ref 0.8–1.8)

## 2015-12-28 LAB — TSH: TSH: 1.71 m[IU]/L (ref 0.40–4.50)

## 2015-12-28 MED ORDER — OMEPRAZOLE 20 MG PO CPDR: 20.0000 mg | DELAYED_RELEASE_CAPSULE | Freq: Every day | ORAL | Status: DC

## 2015-12-28 NOTE — Assessment & Plan Note (Signed)
On chronic prednisone therapy which increases risk of GI bleed; managed by Dr. Jefm Bryant; increase PPI

## 2015-12-28 NOTE — Assessment & Plan Note (Signed)
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

## 2015-12-28 NOTE — Assessment & Plan Note (Signed)
PIN III managed by urologist; check labs today; remote enough smoker, CT chest warranted right now; will start with labs and stool studies and then consider CT scans later, but no cough, no night sweats

## 2015-12-28 NOTE — Patient Instructions (Addendum)
Have labs today Return the stool cards at your convenience We'll have them do the Cologuard test with you Increase the omeprazole to 20 mg daily Avoid spicy and acidic foods listed below while on prednisone  Heartburn Heartburn is a type of pain or discomfort that can happen in the throat or chest. It is often described as a burning pain. It may also cause a bad taste in the mouth. Heartburn may feel worse when you lie down or bend over. It may be caused by stomach contents that move back up (reflux) into the tube that connects the mouth with the stomach (esophagus). HOME CARE Take these actions to lessen your discomfort and to help avoid problems. Diet  Follow a diet as told by your doctor. You may need to avoid foods and drinks such as:  Coffee and tea (with or without caffeine).  Drinks that contain alcohol.  Energy drinks and sports drinks.  Carbonated drinks or sodas.  Chocolate and cocoa.  Peppermint and mint flavorings.  Garlic and onions.  Horseradish.  Spicy and acidic foods, such as peppers, chili powder, curry powder, vinegar, hot sauces, and BBQ sauce.  Citrus fruit juices and citrus fruits, such as oranges, lemons, and limes.  Tomato-based foods, such as red sauce, chili, salsa, and pizza with red sauce.  Fried and fatty foods, such as donuts, french fries, potato chips, and high-fat dressings.  High-fat meats, such as hot dogs, rib eye steak, sausage, ham, and bacon.  High-fat dairy items, such as whole milk, butter, and cream cheese.  Eat small meals often. Avoid eating large meals.  Avoid drinking large amounts of liquid with your meals.  Avoid eating meals during the 2-3 hours before bedtime.  Avoid lying down right after you eat.  Do not exercise right after you eat. General Instructions  Pay attention to any changes in your symptoms.  Take over-the-counter and prescription medicines only as told by your doctor. Do not take aspirin, ibuprofen,  or other NSAIDs unless your doctor says it is okay.  Do not use any tobacco products, including cigarettes, chewing tobacco, and e-cigarettes. If you need help quitting, ask your doctor.  Wear loose clothes. Do not wear anything tight around your waist.  Raise (elevate) the head of your bed about 6 inches (15 cm).  Try to lower your stress. If you need help doing this, ask your doctor.  If you are overweight, lose an amount of weight that is healthy for you. Ask your doctor about a safe weight loss goal.  Keep all follow-up visits as told by your doctor. This is important. GET HELP IF:  You have new symptoms.  You lose weight and you do not know why it is happening.  You have trouble swallowing, or it hurts to swallow.  You have wheezing or a cough that keeps happening.  Your symptoms do not get better with treatment.  You have heartburn often for more than two weeks. GET HELP RIGHT AWAY IF:  You have pain in your arms, neck, jaw, teeth, or back.  You feel sweaty, dizzy, or light-headed.  You have chest pain or shortness of breath.  You throw up (vomit) and your throw up looks like blood or coffee grounds.  Your poop (stool) is bloody or black.   This information is not intended to replace advice given to you by your health care provider. Make sure you discuss any questions you have with your health care provider.   Document Released: 03/07/2011 Document  Revised: 03/16/2015 Document Reviewed: 10/20/2014 Elsevier Interactive Patient Education [employer].

## 2015-12-28 NOTE — Assessment & Plan Note (Signed)
Request records from Dr. Lucky Cowboy; check lipids today; continue aspirin with food; check stool for blood, cards given

## 2015-12-28 NOTE — Progress Notes (Signed)
BP 120/62 mmHg  Pulse 76  Temp(Src) 97.8 F (36.6 C) (Oral)  Resp 14  Wt 140 lb (63.504 kg)  SpO2 98%   Subjective:    Patient ID: Jackson Coffey, male    DOB: 02/10/1941, 75 y.o.   MRN: [id number]  HPI: Jackson Coffey is a 75 y.o. male  Chief Complaint  Patient presents with  . 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 Jefm Bryant Taking prilosec while on prednisone; very minor stomach problems; no acid reflux; some burping; no blood in the stool; saw different color and cleared up the next day; dark stools for a few days then back to normal, so he thought it was just something he ate, chocolate Never had a colonoscopy; he refuses to have a colonoscopy He has lost 16 pounds; his weight was lower than 156, more like 150 pounds by the time the giant cell arteritis started; CRP 6.6 on June 7th, sed rate 16 normal same day He is eating spicy foods every day Elevated PSA; followed by Dr. Edrick Oh; was going every year, then every 6 months; everything went crazy when he was on 60 mg of prednisone; he said to come back 3 months later, dropped a point; not sure if cancer or not; had cystoscopy  Depression screen [facility] 2/9 12/28/2015  Decreased Interest 0  Down, Depressed, Hopeless 0  PHQ - 2 Score 0   Relevant past medical, surgical, family and social history reviewed Past Medical History  Diagnosis Date  . Elevated prostate specific antigen (PSA)   . Facial pain   . Visual changes   . Kidney stones   . Arthritis   . Giant cell arteritis ([place]) 12/28/2015  . Elevated PSA 12/28/2015  . Carotid atherosclerosis 12/28/2015  . PIN III (prostatic intraepithelial neoplasia III) 12/28/2015   Past Surgical History  Procedure Laterality Date  . Kidney surgery      congential defect  . Artery biopsy Left 02/18/2015    Procedure: BIOPSY TEMPORAL ARTERY;  Surgeon: Algernon Huxley,  MD;  Location: [facility] ORS;  Service: Vascular;  Laterality: Left;   History reviewed. No pertinent family history. Social History  Substance Use Topics  . Smoking status: Former Smoker -- 2.00 packs/day    Quit date: 02/16/1977  . Smokeless tobacco: None  . Alcohol Use: No   Interim medical history since last visit reviewed. Allergies and medications reviewed  Review of Systems Per HPI unless specifically indicated above     Objective:    BP 120/62 mmHg  Pulse 76  Temp(Src) 97.8 F (36.6 C) (Oral)  Resp 14  Wt 140 lb (63.504 kg)  SpO2 98%  Wt Readings from Last 3 Encounters:  12/28/15 140 lb (63.504 kg)  02/18/15 156 lb (70.761 kg)  02/17/15 156 lb (70.761 kg)    Physical Exam  Constitutional: He appears well-developed and well-nourished.  Thin, weight loss noted; temporal wasting  HENT:  Head: Normocephalic and atraumatic.  Mouth/Throat: Oropharynx is clear and moist.  Eyes: EOM are normal. No scleral icterus.  Neck: No JVD present. Carotid bruit is not present.  Cardiovascular: Normal rate, regular rhythm and normal heart sounds.   Pulmonary/Chest: Effort normal and breath sounds normal.  Abdominal: Soft. Bowel sounds are normal. He exhibits no distension and no mass. There is no tenderness. There is no guarding.  Musculoskeletal: Normal range of motion.  He exhibits no edema.  Neurological: He is alert.  Skin: Skin is warm and dry. No rash noted. No erythema. No pallor.  Psychiatric: He has a normal mood and affect. His behavior is normal.   Results for orders placed or performed during the hospital encounter of 02/18/15  Surgical pathology  Result Value Ref Range   SURGICAL PATHOLOGY      Surgical Pathology CASE: [id number] PATIENT: Jackson Coffey Surgical Pathology Report     SPECIMEN SUBMITTED: A. Temporal artery, biopsy  CLINICAL HISTORY: None provided  PRE-OPERATIVE DIAGNOSIS: Temporal artery bx  POST-OPERATIVE DIAGNOSIS: same as  preop     DIAGNOSIS: A. LEFT TEMPORAL ARTERY; BIOPSY: - GIANT CELL ARTERITIS.   GROSS DESCRIPTION:  A. The specimen is received in a formalin-filled container labeled with the patient's name and left temporal artery biopsy.  Measurement: 2.5 cm long by 0.2 cm in diameter Other findings: slightly tortuous  Block summary: 1 -submitted intact for cross sectioning at the histology bench Final Diagnosis performed by Delorse Lek, MD.  Electronically signed 02/21/2015 12:01:24PM    The electronic signature indicates that the named Attending Pathologist has evaluated the specimen  Technical component performed at [facility], [address] [place], [place] [address] Lab: [phone] Dir: Darrick Penna. Evette Doffing , MD  Professional component performed at [facility], [place], [place], [place] [address] Lab: [phone] Dir: Dellia Nims. Rubinas, MD        Assessment & Plan:   Problem List Items Addressed This Visit      Cardiovascular and Mediastinum   Carotid atherosclerosis    Request records from Dr. Lucky Cowboy; check lipids today; continue aspirin with food; check stool for blood, cards given      Relevant Medications   aspirin 81 MG tablet   Other Relevant Orders   Lipid panel   Giant cell arteritis ([place])    On chronic prednisone therapy which increases risk of GI bleed; managed by Dr. Jefm Bryant; increase PPI      Relevant Medications   aspirin 81 MG tablet     Genitourinary   PIN III (prostatic intraepithelial neoplasia III)    Managed by urologist; I will defer all PSA testing to him        Other   Elevated PSA    Managed by urologist, with PIN III      Weight loss - Primary    PIN III managed by urologist; check labs today; remote enough smoker, CT chest warranted right now; will start with labs and stool studies and then consider CT scans later, but no cough, no night sweats      Relevant Orders   Cologuard   COMPLETE METABOLIC  PANEL WITH GFR   CBC with Differential/Platelet   TSH   T4, free       Follow up plan: Return in about 2 weeks (around 01/11/2016) for weight and other issues.  An after-visit summary was printed and given to the patient at [place].  Please see the patient instructions which may contain other information and recommendations beyond what is mentioned above in the assessment and plan.  Meds ordered this encounter  Medications  . DISCONTD: omeprazole (PRILOSEC) 10 MG capsule    Sig: Take 10 mg by mouth daily.  Marland Kitchen aspirin 81 MG tablet    Sig: Take 81 mg by mouth daily.  . calcium carbonate (TUMS - DOSED IN MG ELEMENTAL CALCIUM) 500 MG chewable tablet    Sig: Chew 1 tablet by mouth  daily.  . predniSONE (DELTASONE) 5 MG tablet    Sig: Take 0.5 tablets (2.5 mg total) by mouth daily with breakfast.  . omeprazole (PRILOSEC) 20 MG capsule    Sig: Take 1 capsule (20 mg total) by mouth daily.    Dispense:  30 capsule    Refill:  2    Orders Placed This Encounter  Procedures  . Cologuard  . COMPLETE METABOLIC PANEL WITH GFR  . CBC with Differential/Platelet  . Lipid panel  . TSH  . T4, free

## 2015-12-28 NOTE — Assessment & Plan Note (Signed)
Managed by urologist; I will defer all PSA testing to him

## 2016-01-11 ENCOUNTER — Encounter: Payer: Self-pay | Admitting: Family Medicine

## 2016-01-11 ENCOUNTER — Ambulatory Visit (INDEPENDENT_AMBULATORY_CARE_PROVIDER_SITE_OTHER): Payer: PPO | Admitting: Family Medicine

## 2016-01-11 DIAGNOSIS — M316 Other giant cell arteritis: Secondary | ICD-10-CM | POA: Diagnosis not present

## 2016-01-11 DIAGNOSIS — Z1211 Encounter for screening for malignant neoplasm of colon: Secondary | ICD-10-CM

## 2016-01-11 NOTE — Progress Notes (Signed)
BP 126/72   Pulse 78   Temp 98.8 F (37.1 C) (Oral)   Resp 16   Ht '5\' 10"'  (1.778 m)   Wt 143 lb 9.6 oz (65.1 kg)   SpO2 99%   BMI 20.60 kg/m    Subjective:    Patient ID: Jackson Coffey, male    DOB: Mar 17, 1941, 75 y.o.   MRN: [id number]  HPI: Jackson Coffey is a 75 y.o. male  Chief Complaint  Patient presents with  . Follow-up    2 week recheck weight   He is eating well; appetite is better; gained over 3.5 pounds; no swelling and doesn't think fluid Stool cards negative x 3 Had some abdominal symptoms occasionally; helped by prilosec; thinks it is gas; no abdominal symptoms right now, all resolved He had lost weight, the reason for close f/u; he has several labs drawn last visit and these are reviewed in detail today with him  Depression screen [facility] 2/9 01/11/2016 12/28/2015  Decreased Interest 0 0  Down, Depressed, Hopeless 0 0  PHQ - 2 Score 0 0   Relevant past medical, surgical, family and social history reviewed Past Medical History:  Diagnosis Date  . Arthritis   . Carotid atherosclerosis 12/28/2015  . Elevated PSA   . Giant cell arteritis ([place]) 12/28/2015  . Kidney stones   . PIN III (prostatic intraepithelial neoplasia III) 12/28/2015   Past Surgical History:  Procedure Laterality Date  . ARTERY BIOPSY Left 02/18/2015   Procedure: BIOPSY TEMPORAL ARTERY;  Surgeon: Algernon Huxley, MD;  Location: [facility] ORS;  Service: Vascular;  Laterality: Left;  . KIDNEY SURGERY     congential defect   Interim medical history since last visit reviewed. Allergies and medications reviewed  Review of Systems  HENT: Negative for nosebleeds and sore throat.   Eyes: Negative for visual disturbance.  Respiratory: Negative for cough.   Gastrointestinal: Negative for abdominal pain and blood in stool.  Genitourinary: Negative for hematuria.  Skin:       No worrisome moles  Neurological:       Giant cell arteritis; goes back to see them for test next month    Hematological: Negative for adenopathy. Bruises/bleeds easily (easy bruising of arms, going on 10 years).  Psychiatric/Behavioral: Negative for dysphoric mood.   Per HPI unless specifically indicated above     Objective:    BP 126/72   Pulse 78   Temp 98.8 F (37.1 C) (Oral)   Resp 16   Ht '5\' 10"'  (1.778 m)   Wt 143 lb 9.6 oz (65.1 kg)   SpO2 99%   BMI 20.60 kg/m   Wt Readings from Last 3 Encounters:  01/11/16 143 lb 9.6 oz (65.1 kg)  12/28/15 140 lb (63.5 kg)  02/18/15 156 lb (70.8 kg)    Physical Exam  Constitutional: He appears well-developed and well-nourished.  Normal BMI, weight gain almost 4 pounds over the last 2+ weeks  Eyes: EOM are normal. No scleral icterus.  Neck: No JVD present. Carotid bruit is not present.  Cardiovascular: Normal rate and regular rhythm.   Pulmonary/Chest: Effort normal and breath sounds normal.  Abdominal: Soft. Bowel sounds are normal. He exhibits no distension. There is no tenderness.  Musculoskeletal: He exhibits no edema.  Neurological: He is alert.  Skin: Skin is warm and dry. No pallor.  Psychiatric: He has a normal mood and affect. His behavior is normal.    Results for orders placed or performed in visit on  12/28/15  Cologuard  Result Value Ref Range   Cologuard    COMPLETE METABOLIC PANEL WITH GFR  Result Value Ref Range   Sodium 143 135 - 146 mmol/L   Potassium 4.8 3.5 - 5.3 mmol/L   Chloride 104 98 - 110 mmol/L   CO2 32 (H) 20 - 31 mmol/L   Glucose, Bld 84 65 - 99 mg/dL   BUN 15 7 - 25 mg/dL   Creat 0.95 0.70 - 1.18 mg/dL   Total Bilirubin 0.5 0.2 - 1.2 mg/dL   Alkaline Phosphatase 110 40 - 115 U/L   AST 20 10 - 35 U/L   ALT 12 9 - 46 U/L   Total Protein 6.1 6.1 - 8.1 g/dL   Albumin 4.0 3.6 - 5.1 g/dL   Calcium 9.6 8.6 - 10.3 mg/dL   GFR, Est African American >89 >=60 mL/min   GFR, Est Non African American 78 >=60 mL/min  CBC with Differential/Platelet  Result Value Ref Range   WBC 7.1 3.8 - 10.8 K/uL   RBC  4.24 4.20 - 5.80 MIL/uL   Hemoglobin 13.6 13.2 - 17.1 g/dL   HCT 40.4 38.5 - 50.0 %   MCV 95.3 80.0 - 100.0 fL   MCH 32.1 27.0 - 33.0 pg   MCHC 33.7 32.0 - 36.0 g/dL   RDW 12.4 11.0 - 15.0 %   Platelets 272 140 - 400 K/uL   MPV 10.3 7.5 - 12.5 fL   Neutro Abs 4,828 1,500 - 7,800 cells/uL   Lymphs Abs 1,491 850 - 3,900 cells/uL   Monocytes Absolute 568 200 - 950 cells/uL   Eosinophils Absolute 213 15 - 500 cells/uL   Basophils Absolute 0 0 - 200 cells/uL   Neutrophils Relative % 68 %   Lymphocytes Relative 21 %   Monocytes Relative 8 %   Eosinophils Relative 3 %   Basophils Relative 0 %   Smear Review Criteria for review not met   Lipid panel  Result Value Ref Range   Cholesterol 161 125 - 200 mg/dL   Triglycerides 134 <150 mg/dL   HDL 43 >=40 mg/dL   Total CHOL/HDL Ratio 3.7 <=5.0 Ratio   VLDL 27 <30 mg/dL   LDL Cholesterol 91 <130 mg/dL  TSH  Result Value Ref Range   TSH 1.71 0.40 - 4.50 mIU/L  T4, free  Result Value Ref Range   Free T4 1.2 0.8 - 1.8 ng/dL      Assessment & Plan:   Problem List Items Addressed This Visit      Cardiovascular and Mediastinum   Giant cell arteritis ([place])    On steroids; may have been cause of the weight loss; he is feeling better now, eating better; weight loss has resolved; continue f/u with specialist        Other   Colon cancer screening    cologuard       Other Visit Diagnoses   None.     Follow up plan: Return in about 1 year (around 01/10/2017).  An after-visit summary was printed and given to the patient at [place].  Please see the patient instructions which may contain other information and recommendations beyond what is mentioned above in the assessment and plan.

## 2016-01-11 NOTE — Patient Instructions (Signed)
Keep an eye on your weight and if your weight starts to drop, contact me We'll get the Cologuard done If you have not heard about those results 2 weeks after you send in the specimen, give [place] a call

## 2016-01-18 DIAGNOSIS — M316 Other giant cell arteritis: Secondary | ICD-10-CM | POA: Diagnosis not present

## 2016-01-30 DIAGNOSIS — Z1212 Encounter for screening for malignant neoplasm of rectum: Secondary | ICD-10-CM | POA: Diagnosis not present

## 2016-01-30 DIAGNOSIS — Z1211 Encounter for screening for malignant neoplasm of colon: Secondary | ICD-10-CM | POA: Diagnosis not present

## 2016-01-31 LAB — COLOGUARD

## 2016-02-22 DIAGNOSIS — M316 Other giant cell arteritis: Secondary | ICD-10-CM | POA: Diagnosis not present

## 2016-03-11 ENCOUNTER — Encounter: Payer: Self-pay | Admitting: Family Medicine

## 2016-03-11 DIAGNOSIS — Z1211 Encounter for screening for malignant neoplasm of colon: Secondary | ICD-10-CM | POA: Insufficient documentation

## 2016-03-11 NOTE — Assessment & Plan Note (Signed)
On steroids; may have been cause of the weight loss; he is feeling better now, eating better; weight loss has resolved; continue f/u with specialist

## 2016-03-11 NOTE — Assessment & Plan Note (Signed)
cologuard

## 2016-03-19 DIAGNOSIS — M542 Cervicalgia: Secondary | ICD-10-CM | POA: Diagnosis not present

## 2016-03-19 DIAGNOSIS — M316 Other giant cell arteritis: Secondary | ICD-10-CM | POA: Diagnosis not present

## 2016-03-19 DIAGNOSIS — I779 Disorder of arteries and arterioles, unspecified: Secondary | ICD-10-CM | POA: Diagnosis not present

## 2016-04-04 DIAGNOSIS — M316 Other giant cell arteritis: Secondary | ICD-10-CM | POA: Diagnosis not present

## 2016-04-11 DIAGNOSIS — M316 Other giant cell arteritis: Secondary | ICD-10-CM | POA: Diagnosis not present

## 2016-04-11 DIAGNOSIS — M542 Cervicalgia: Secondary | ICD-10-CM | POA: Diagnosis not present

## 2016-05-09 DIAGNOSIS — M316 Other giant cell arteritis: Secondary | ICD-10-CM | POA: Diagnosis not present

## 2016-05-16 DIAGNOSIS — M542 Cervicalgia: Secondary | ICD-10-CM | POA: Diagnosis not present

## 2016-05-16 DIAGNOSIS — M316 Other giant cell arteritis: Secondary | ICD-10-CM | POA: Diagnosis not present

## 2016-05-25 DIAGNOSIS — N138 Other obstructive and reflux uropathy: Secondary | ICD-10-CM | POA: Diagnosis not present

## 2016-05-25 DIAGNOSIS — R339 Retention of urine, unspecified: Secondary | ICD-10-CM | POA: Diagnosis not present

## 2016-05-25 DIAGNOSIS — N403 Nodular prostate with lower urinary tract symptoms: Secondary | ICD-10-CM | POA: Diagnosis not present

## 2016-05-25 DIAGNOSIS — N200 Calculus of kidney: Secondary | ICD-10-CM | POA: Diagnosis not present

## 2016-05-25 DIAGNOSIS — D075 Carcinoma in situ of prostate: Secondary | ICD-10-CM | POA: Diagnosis not present

## 2016-05-25 DIAGNOSIS — R972 Elevated prostate specific antigen [PSA]: Secondary | ICD-10-CM | POA: Diagnosis not present

## 2016-06-13 DIAGNOSIS — M316 Other giant cell arteritis: Secondary | ICD-10-CM | POA: Diagnosis not present

## 2016-07-10 ENCOUNTER — Telehealth: Payer: Self-pay | Admitting: Family Medicine

## 2016-07-10 NOTE — Telephone Encounter (Signed)
Thank you :)

## 2016-07-10 NOTE — Telephone Encounter (Signed)
Pt states he was told to call our office back in 6 months to give a weigh in. Pt weighed this morning and he is at 156.

## 2016-07-18 DIAGNOSIS — M316 Other giant cell arteritis: Secondary | ICD-10-CM | POA: Diagnosis not present

## 2016-08-15 ENCOUNTER — Ambulatory Visit (INDEPENDENT_AMBULATORY_CARE_PROVIDER_SITE_OTHER): Payer: Self-pay | Admitting: Vascular Surgery

## 2016-08-15 ENCOUNTER — Encounter (INDEPENDENT_AMBULATORY_CARE_PROVIDER_SITE_OTHER): Payer: Self-pay

## 2016-08-22 DIAGNOSIS — M316 Other giant cell arteritis: Secondary | ICD-10-CM | POA: Diagnosis not present

## 2016-08-30 DIAGNOSIS — M542 Cervicalgia: Secondary | ICD-10-CM | POA: Diagnosis not present

## 2016-08-30 DIAGNOSIS — M316 Other giant cell arteritis: Secondary | ICD-10-CM | POA: Diagnosis not present

## 2016-09-11 ENCOUNTER — Other Ambulatory Visit (INDEPENDENT_AMBULATORY_CARE_PROVIDER_SITE_OTHER): Payer: Self-pay | Admitting: Vascular Surgery

## 2016-09-11 DIAGNOSIS — M316 Other giant cell arteritis: Secondary | ICD-10-CM

## 2016-09-12 ENCOUNTER — Ambulatory Visit (INDEPENDENT_AMBULATORY_CARE_PROVIDER_SITE_OTHER): Payer: Self-pay | Admitting: Vascular Surgery

## 2016-09-12 ENCOUNTER — Encounter (INDEPENDENT_AMBULATORY_CARE_PROVIDER_SITE_OTHER): Payer: Self-pay

## 2016-09-12 ENCOUNTER — Ambulatory Visit (INDEPENDENT_AMBULATORY_CARE_PROVIDER_SITE_OTHER): Payer: PPO

## 2016-09-12 DIAGNOSIS — M316 Other giant cell arteritis: Secondary | ICD-10-CM

## 2016-09-20 ENCOUNTER — Encounter (INDEPENDENT_AMBULATORY_CARE_PROVIDER_SITE_OTHER): Payer: Self-pay | Admitting: Vascular Surgery

## 2016-10-10 DIAGNOSIS — M316 Other giant cell arteritis: Secondary | ICD-10-CM | POA: Diagnosis not present

## 2016-10-17 DIAGNOSIS — H25813 Combined forms of age-related cataract, bilateral: Secondary | ICD-10-CM | POA: Diagnosis not present

## 2016-11-14 DIAGNOSIS — M316 Other giant cell arteritis: Secondary | ICD-10-CM | POA: Diagnosis not present

## 2016-12-17 DIAGNOSIS — H2511 Age-related nuclear cataract, right eye: Secondary | ICD-10-CM | POA: Diagnosis not present

## 2016-12-18 DIAGNOSIS — M542 Cervicalgia: Secondary | ICD-10-CM | POA: Diagnosis not present

## 2016-12-18 DIAGNOSIS — M316 Other giant cell arteritis: Secondary | ICD-10-CM | POA: Diagnosis not present

## 2016-12-18 DIAGNOSIS — Z79899 Other long term (current) drug therapy: Secondary | ICD-10-CM | POA: Diagnosis not present

## 2017-01-02 DIAGNOSIS — M810 Age-related osteoporosis without current pathological fracture: Secondary | ICD-10-CM | POA: Diagnosis not present

## 2017-01-16 ENCOUNTER — Encounter: Payer: Self-pay | Admitting: Family Medicine

## 2017-01-16 ENCOUNTER — Ambulatory Visit (INDEPENDENT_AMBULATORY_CARE_PROVIDER_SITE_OTHER): Payer: PPO | Admitting: Family Medicine

## 2017-01-16 DIAGNOSIS — Z5181 Encounter for therapeutic drug level monitoring: Secondary | ICD-10-CM

## 2017-01-16 DIAGNOSIS — I6523 Occlusion and stenosis of bilateral carotid arteries: Secondary | ICD-10-CM

## 2017-01-16 DIAGNOSIS — Z66 Do not resuscitate: Secondary | ICD-10-CM | POA: Insufficient documentation

## 2017-01-16 DIAGNOSIS — Z0000 Encounter for general adult medical examination without abnormal findings: Secondary | ICD-10-CM | POA: Diagnosis not present

## 2017-01-16 DIAGNOSIS — D075 Carcinoma in situ of prostate: Secondary | ICD-10-CM | POA: Diagnosis not present

## 2017-01-16 HISTORY — DX: Do not resuscitate: Z66

## 2017-01-16 LAB — POCT GLYCOSYLATED HEMOGLOBIN (HGB A1C): Hemoglobin A1C: 5.1

## 2017-01-16 LAB — GLUCOSE, POCT (MANUAL RESULT ENTRY): POC Glucose: 90 mg/dl (ref 70–99)

## 2017-01-16 NOTE — Assessment & Plan Note (Signed)
USPSTF grade A and B recommendations reviewed with patient; age-appropriate recommendations, preventive care, screening tests, etc discussed and encouraged; healthy living encouraged; see AVS for patient education given to patient  

## 2017-01-16 NOTE — Patient Instructions (Addendum)
You will be due for your next PSA on or just after May 25, 2017 Talk to your pharmacist about Shingrix vaccine    Health Maintenance, Male A healthy lifestyle and preventive care is important for your health and wellness. Ask your health care provider about what schedule of regular examinations is right for you. What should I know about weight and diet? Eat a Healthy Diet  Eat plenty of vegetables, fruits, whole grains, low-fat dairy products, and lean protein.  Do not eat a lot of foods high in solid fats, added sugars, or salt.  Maintain a Healthy Weight Regular exercise can help you achieve or maintain a healthy weight. You should:  Do at least 150 minutes of exercise each week. The exercise should increase your heart rate and make you sweat (moderate-intensity exercise).  Do strength-training exercises at least twice a week.  Watch Your Levels of Cholesterol and Blood Lipids  Have your blood tested for lipids and cholesterol every 5 years starting at 76 years of age. If you are at high risk for heart disease, you should start having your blood tested when you are 76 years old. You may need to have your cholesterol levels checked more often if: ? Your lipid or cholesterol levels are high. ? You are older than 76 years of age. ? You are at high risk for heart disease.  What should I know about cancer screening? Many types of cancers can be detected early and may often be prevented. Lung Cancer  You should be screened every year for lung cancer if: ? You are a current smoker who has smoked for at least 30 years. ? You are a former smoker who has quit within the past 15 years.  Talk to your health care provider about your screening options, when you should start screening, and how often you should be screened.  Colorectal Cancer  Routine colorectal cancer screening usually begins at 76 years of age and should be repeated every 5-10 years until you are 76 years old. You  may need to be screened more often if early forms of precancerous polyps or small growths are found. Your health care provider may recommend screening at an earlier age if you have risk factors for colon cancer.  Your health care provider may recommend using home test kits to check for hidden blood in the stool.  A small camera at the end of a tube can be used to examine your colon (sigmoidoscopy or colonoscopy). This checks for the earliest forms of colorectal cancer.  Prostate and Testicular Cancer  Depending on your age and overall health, your health care provider may do certain tests to screen for prostate and testicular cancer.  Talk to your health care provider about any symptoms or concerns you have about testicular or prostate cancer.  Skin Cancer  Check your skin from head to toe regularly.  Tell your health care provider about any new moles or changes in moles, especially if: ? There is a change in a mole's size, shape, or color. ? You have a mole that is larger than a pencil eraser.  Always use sunscreen. Apply sunscreen liberally and repeat throughout the day.  Protect yourself by wearing long sleeves, pants, a wide-brimmed hat, and sunglasses when outside.  What should I know about heart disease, diabetes, and high blood pressure?  If you are 35-60 years of age, have your blood pressure checked every 3-5 years. If you are 88 years of age or older, have  your blood pressure checked every year. You should have your blood pressure measured twice-once when you are at a hospital or clinic, and once when you are not at a hospital or clinic. Record the average of the two measurements. To check your blood pressure when you are not at a hospital or clinic, you can use: ? An automated blood pressure machine at a pharmacy. ? A home blood pressure monitor.  Talk to your health care provider about your target blood pressure.  If you are between 53-29 years old, ask your health care  provider if you should take aspirin to prevent heart disease.  Have regular diabetes screenings by checking your fasting blood sugar level. ? If you are at a normal weight and have a low risk for diabetes, have this test once every three years after the age of 74. ? If you are overweight and have a high risk for diabetes, consider being tested at a younger age or more often.  A one-time screening for abdominal aortic aneurysm (AAA) by ultrasound is recommended for men aged 44-75 years who are current or former smokers. What should I know about preventing infection? Hepatitis B If you have a higher risk for hepatitis B, you should be screened for this virus. Talk with your health care provider to find out if you are at risk for hepatitis B infection. Hepatitis C Blood testing is recommended for:  Everyone born from [birth year] through 1965.  Anyone with known risk factors for hepatitis C.  Sexually Transmitted Diseases (STDs)  You should be screened each year for STDs including gonorrhea and chlamydia if: ? You are sexually active and are younger than 76 years of age. ? You are older than 76 years of age and your health care provider tells you that you are at risk for this type of infection. ? Your sexual activity has changed since you were last screened and you are at an increased risk for chlamydia or gonorrhea. Ask your health care provider if you are at risk.  Talk with your health care provider about whether you are at high risk of being infected with HIV. Your health care provider may recommend a prescription medicine to help prevent HIV infection.  What else can I do?  Schedule regular health, dental, and eye exams.  Stay current with your vaccines (immunizations).  Do not use any tobacco products, such as cigarettes, chewing tobacco, and e-cigarettes. If you need help quitting, ask your health care provider.  Limit alcohol intake to no more than 2 drinks per day. One drink equals 12  ounces of beer, 5 ounces of wine, or 1 ounces of hard liquor.  Do not use street drugs.  Do not share needles.  Ask your health care provider for help if you need support or information about quitting drugs.  Tell your health care provider if you often feel depressed.  Tell your health care provider if you have ever been abused or do not feel safe at home. This information is not intended to replace advice given to you by your health care provider. Make sure you discuss any questions you have with your health care provider. Document Released: 12/22/2007 Document Revised: 02/22/2016 Document Reviewed: 03/29/2015 Elsevier Interactive Patient Education  [employer].

## 2017-01-16 NOTE — Progress Notes (Signed)
Patient: Jackson Coffey, Male    DOB: May 14, 1941, 76 y.o.   MRN: [id number]  Visit Date: 01/16/2017  Today's Provider: Enid Derry, MD   Chief Complaint  Patient presents with  . Medicare Wellness    Subjective:   Jackson Coffey is a 76 y.o. male who presents today for his Subsequent Annual Wellness Visit.  Caregiver input:  Wife here Tapering down on the prednisone; Dr. Jefm Bryant managing that  USPSTF grade A and B recommendations Depression:  Depression screen [facility] 2/9 01/16/2017 01/11/2016 12/28/2015  Decreased Interest 0 0 0  Down, Depressed, Hopeless 0 0 0  PHQ - 2 Score 0 0 0   Hypertension: BP Readings from Last 3 Encounters:  01/16/17 136/64  01/11/16 126/72  12/28/15 120/62   Obesity: Wt Readings from Last 3 Encounters:  01/16/17 149 lb 9.6 oz (67.9 kg)  07/10/16 156 lb (70.8 kg)  01/11/16 143 lb 9.6 oz (65.1 kg)   BMI Readings from Last 3 Encounters:  01/16/17 22.09 kg/m  07/10/16 22.38 kg/m  01/11/16 20.60 kg/m    Alcohol: no Tobacco use: n/a HIV, hep B, hep C: declined Married STD testing and prevention (chl/gon/syphilis): declined Lipids:  Lab Results  Component Value Date   CHOL 161 12/28/2015   Lab Results  Component Value Date   HDL 43 12/28/2015   Lab Results  Component Value Date   LDLCALC 91 12/28/2015   Lab Results  Component Value Date   TRIG 134 12/28/2015   Lab Results  Component Value Date   CHOLHDL 3.7 12/28/2015   No results found for: LDLDIRECT Glucose:  Glucose, Bld  Date Value Ref Range Status  12/28/2015 84 65 - 99 mg/dL Final  02/17/2015 107 (H) 65 - 99 mg/dL Final   Colorectal cancer: cologuard last year; discussed stopping after age 64 Prostate cancer: sees Dr. Jacqlyn Larsen; he moved to [facility]; once a year, he gets PSA and gets Rx for proscar  No results found for: PSA  PSA was 2.16 on May 25, 2016 Lung cancer:   Quit smoking 40 years ago AAA:  n/a Aspirin: taking 81 mg daily Diet: getting calcium  in MV, taking tums too; three servings a day Exercise: active Skin cancer: has had a few of those; not seeing derm right now; years ago; nothing new, wife watchign DEXA: on fosamax since last week, per rheum  HPI  Review of Systems  Past Medical History:  Diagnosis Date  . Arthritis   . Carotid atherosclerosis 12/28/2015  . Do not resuscitate status 01/16/2017   Discussed with patient, has living will  . Elevated PSA   . Giant cell arteritis ([place]) 12/28/2015  . Kidney stones   . PIN III (prostatic intraepithelial neoplasia III) 12/28/2015    Past Surgical History:  Procedure Laterality Date  . ARTERY BIOPSY Left 02/18/2015   Procedure: BIOPSY TEMPORAL ARTERY;  Surgeon: Algernon Huxley, MD;  Location: [facility] ORS;  Service: Vascular;  Laterality: Left;  . KIDNEY SURGERY     congential defect    Family History  Problem Relation Age of Onset  . Parkinson's disease Mother   . Heart attack Father   . Thyroid disease Daughter   . Stroke Maternal Grandmother   . Heart disease Maternal Grandmother   . Cancer Paternal Grandfather        unknown    Social History   Social History  . Marital status: Married    Spouse name: N/A  . Number of children: N/A  .  Years of education: N/A   Occupational History  . Not on file.   Social History Main Topics  . Smoking status: Former Smoker    Packs/day: 2.00    Quit date: 02/16/1977  . Smokeless tobacco: Never Used  . Alcohol use No  . Drug use: No  . Sexual activity: Not Currently   Other Topics Concern  . Not on file   Social History Narrative  . No narrative on file    Outpatient Encounter Prescriptions as of 01/16/2017  Medication Sig  . alendronate (FOSAMAX) 70 MG tablet Take 70 mg by mouth once a week.  Marland Kitchen aspirin 81 MG tablet Take 81 mg by mouth daily.  . calcium carbonate (TUMS - DOSED IN MG ELEMENTAL CALCIUM) 500 MG chewable tablet Chew 1 tablet by mouth daily.  . finasteride (PROSCAR) 5 MG tablet Take 5 mg by mouth daily.   . Multiple Vitamin (MULTIVITAMIN) tablet Take 1 tablet by mouth daily.  Marland Kitchen omeprazole (PRILOSEC) 20 MG capsule Take 1 capsule (20 mg total) by mouth daily.  . predniSONE (DELTASONE) 5 MG tablet Take 0.5 tablets (2.5 mg total) by mouth daily with breakfast.   No facility-administered encounter medications on file as of 01/16/2017.     Functional Ability / Safety Screening 1.  Was the timed Get Up and Go test longer than 30 seconds?  no 2.  Does the patient need help with the phone, transportation, shopping,      preparing meals, housework, laundry, medications, or managing money?  no 3.  Does the patient's home have:  loose throw rugs in the hallway?   no      Grab bars in the bathroom? yes      Handrails on the stairs?   yes      Poor lighting?   no 4.  Has the patient noticed any hearing difficulties?   yes, from [employer]  Fall Risk Assessment See under rooming  Depression Screen See under rooming Depression screen [facility] 2/9 01/16/2017 01/11/2016 12/28/2015  Decreased Interest 0 0 0  Down, Depressed, Hopeless 0 0 0  PHQ - 2 Score 0 0 0    Advanced Directives Does patient have a HCPOA?    yes If yes, name and contact information: Luvenia Redden [phone] Does patient have a living will or MOST form?  yes    Objective:   Vitals: BP 136/64   Pulse 80   Temp 98.1 F (36.7 C) (Oral)   Resp 14   Ht 5\' 9"  (1.753 m)   Wt 149 lb 9.6 oz (67.9 kg)   SpO2 98%   BMI 22.09 kg/m  Body mass index is 22.09 kg/m. No exam data present  Physical Exam Mood/affect:  euthymic Appearance:  Casually dressed, thin but not cachectic; good hygine  6CIT Screen 01/16/2017  What Year? 0 points  What month? 0 points  What time? 0 points  Count back from 20 0 points  Months in reverse 0 points  Repeat phrase 2 points  Total Score 2    Assessment & Plan:     Annual Wellness Visit  Reviewed patient's Family Medical History Reviewed and updated list of patient's medical  providers Assessment of cognitive impairment was done Assessed patient's functional ability Established a written schedule for health screening [place] Completed and Reviewed  Exercise Activities and Dietary recommendations Goals    None    maybe eat a little healthier  Immunization History  Administered Date(s) Administered  .  Influenza-Unspecified 03/13/2016  discussed shingles vaccine, Shingrix  Health Maintenance  Topic Date Due  . TETANUS/TDAP  12/25/1959  . PNA vac Low Risk Adult (1 of 2 - PCV13) 12/24/2005  . INFLUENZA VACCINE  02/06/2017    Discussed health benefits of physical activity, and encouraged him to engage in regular exercise appropriate for his age and condition.   Meds ordered this encounter  Medications  . alendronate (FOSAMAX) 70 MG tablet    Sig: Take 70 mg by mouth once a week.    Current Outpatient Prescriptions:  .  alendronate (FOSAMAX) 70 MG tablet, Take 70 mg by mouth once a week., Disp: , Rfl:  .  aspirin 81 MG tablet, Take 81 mg by mouth daily., Disp: , Rfl:  .  calcium carbonate (TUMS - DOSED IN MG ELEMENTAL CALCIUM) 500 MG chewable tablet, Chew 1 tablet by mouth daily., Disp: , Rfl:  .  finasteride (PROSCAR) 5 MG tablet, Take 5 mg by mouth daily., Disp: , Rfl:  .  Multiple Vitamin (MULTIVITAMIN) tablet, Take 1 tablet by mouth daily., Disp: , Rfl:  .  omeprazole (PRILOSEC) 20 MG capsule, Take 1 capsule (20 mg total) by mouth daily., Disp: 30 capsule, Rfl: 2 .  predniSONE (DELTASONE) 5 MG tablet, Take 0.5 tablets (2.5 mg total) by mouth daily with breakfast., Disp: , Rfl:  There are no discontinued medications.  Next Medicare Wellness Visit in 12+ months  Problem List Items Addressed This Visit      Cardiovascular and Mediastinum   Carotid atherosclerosis    Monitored by vascular surgeon        Genitourinary   PIN III (prostatic intraepithelial neoplasia III)    Patient wishes to have PSA drawn through this  office, will be due in November he says; I'll put that order in and refill meds when needed at his request        Other   Preventative health care    USPSTF grade A and B recommendations reviewed with patient; age-appropriate recommendations, preventive care, screening tests, etc discussed and encouraged; healthy living encouraged; see AVS for patient education given to patient      Medication monitoring encounter    Glucose and a1c      Relevant Orders   POCT HgB A1C (Completed)   POCT Glucose (CBG) (Completed)   Do not resuscitate status

## 2017-01-16 NOTE — Assessment & Plan Note (Signed)
Patient wishes to have PSA drawn through this office, will be due in November he says; I'll put that order in and refill meds when needed at his request

## 2017-01-16 NOTE — Assessment & Plan Note (Signed)
Monitored by vascular surgeon

## 2017-01-16 NOTE — Assessment & Plan Note (Signed)
Glucose and a1c

## 2017-01-30 DIAGNOSIS — Z79899 Other long term (current) drug therapy: Secondary | ICD-10-CM | POA: Diagnosis not present

## 2017-01-30 DIAGNOSIS — M316 Other giant cell arteritis: Secondary | ICD-10-CM | POA: Diagnosis not present

## 2017-03-06 DIAGNOSIS — M316 Other giant cell arteritis: Secondary | ICD-10-CM | POA: Diagnosis not present

## 2017-04-10 DIAGNOSIS — M316 Other giant cell arteritis: Secondary | ICD-10-CM | POA: Diagnosis not present

## 2017-04-26 DIAGNOSIS — D229 Melanocytic nevi, unspecified: Secondary | ICD-10-CM | POA: Diagnosis not present

## 2017-04-26 DIAGNOSIS — C44311 Basal cell carcinoma of skin of nose: Secondary | ICD-10-CM | POA: Diagnosis not present

## 2017-04-26 DIAGNOSIS — L820 Inflamed seborrheic keratosis: Secondary | ICD-10-CM | POA: Diagnosis not present

## 2017-04-26 DIAGNOSIS — Z85828 Personal history of other malignant neoplasm of skin: Secondary | ICD-10-CM | POA: Diagnosis not present

## 2017-05-15 DIAGNOSIS — M316 Other giant cell arteritis: Secondary | ICD-10-CM | POA: Diagnosis not present

## 2017-05-28 DIAGNOSIS — C44311 Basal cell carcinoma of skin of nose: Secondary | ICD-10-CM | POA: Diagnosis not present

## 2017-05-28 DIAGNOSIS — L814 Other melanin hyperpigmentation: Secondary | ICD-10-CM | POA: Diagnosis not present

## 2017-05-28 DIAGNOSIS — Z85828 Personal history of other malignant neoplasm of skin: Secondary | ICD-10-CM | POA: Diagnosis not present

## 2017-05-28 DIAGNOSIS — L821 Other seborrheic keratosis: Secondary | ICD-10-CM | POA: Diagnosis not present

## 2017-05-28 DIAGNOSIS — L578 Other skin changes due to chronic exposure to nonionizing radiation: Secondary | ICD-10-CM | POA: Diagnosis not present

## 2017-05-31 DIAGNOSIS — C4490 Unspecified malignant neoplasm of skin, unspecified: Secondary | ICD-10-CM

## 2017-05-31 HISTORY — DX: Unspecified malignant neoplasm of skin, unspecified: C44.90

## 2017-06-03 ENCOUNTER — Encounter: Payer: Self-pay | Admitting: Family Medicine

## 2017-06-03 ENCOUNTER — Ambulatory Visit (INDEPENDENT_AMBULATORY_CARE_PROVIDER_SITE_OTHER): Payer: PPO | Admitting: Family Medicine

## 2017-06-03 VITALS — BP 120/72 | HR 76 | Temp 97.6°F | Resp 16 | Ht 68.05 in | Wt 152.7 lb

## 2017-06-03 DIAGNOSIS — Z1211 Encounter for screening for malignant neoplasm of colon: Secondary | ICD-10-CM

## 2017-06-03 DIAGNOSIS — R972 Elevated prostate specific antigen [PSA]: Secondary | ICD-10-CM

## 2017-06-03 DIAGNOSIS — Z85828 Personal history of other malignant neoplasm of skin: Secondary | ICD-10-CM | POA: Insufficient documentation

## 2017-06-03 DIAGNOSIS — Z5181 Encounter for therapeutic drug level monitoring: Secondary | ICD-10-CM | POA: Diagnosis not present

## 2017-06-03 DIAGNOSIS — M316 Other giant cell arteritis: Secondary | ICD-10-CM | POA: Diagnosis not present

## 2017-06-03 DIAGNOSIS — Z9889 Other specified postprocedural states: Secondary | ICD-10-CM | POA: Diagnosis not present

## 2017-06-03 DIAGNOSIS — D075 Carcinoma in situ of prostate: Secondary | ICD-10-CM

## 2017-06-03 LAB — POC HEMOCCULT BLD/STL (HOME/3-CARD/SCREEN)
FECAL OCCULT BLD: NEGATIVE
OCCULT BLOOD DATE: 112662018

## 2017-06-03 MED ORDER — FINASTERIDE 5 MG PO TABS: 5.0000 mg | ORAL_TABLET | Freq: Every day | ORAL | 3 refills | Status: DC

## 2017-06-03 NOTE — Assessment & Plan Note (Signed)
Still on prednisone, managed by rheum

## 2017-06-03 NOTE — Patient Instructions (Addendum)
We'll check labs and forward the results to Dr. Jacqlyn Larsen Return for a Medicare Wellness visit when due

## 2017-06-03 NOTE — Assessment & Plan Note (Signed)
Appears to be healing appropriately

## 2017-06-03 NOTE — Progress Notes (Signed)
BP 120/72   Pulse 76   Temp 97.6 F (36.4 C) (Oral)   Resp 16   Ht 5' 8.05" (1.728 m)   Wt 152 lb 11.2 oz (69.3 kg)   SpO2 97%   BMI 23.18 kg/m    Subjective:    Patient ID: Jackson Coffey, male    DOB: 05/08/1941, 76 y.o.   MRN: [id number]  HPI: Jackson Coffey is a 76 y.o. male  Chief Complaint  Patient presents with  . Medicare Wellness    HPI Patient says he is not here for a Medicare Wellness Actually needs PSA done and refills of medicines History of prostatic intraepithelial neoplasia III; has had biopsies x 2, used to see Dr. Jacqlyn Larsen Finasteride is working well Urine stream is good and strong; a little time waiting to get urine stream started just occasionally; few drips of dribbling urine at the end; gets up none or just once at night Last DRE was a year ago  Giant cell arteritis; alternating 2.5 mg and 5 mg daily of prednisone Also on aspirin; taking PPI, and not having any problems with heartburn, "can eat nails and it don't bother me"  Just had MOH's procedure on the LEFT nasolabial fold last week; no fevers; [facility]  Depression screen [facility] 2/9 06/03/2017 01/16/2017 01/11/2016 12/28/2015  Decreased Interest 0 0 0 0  Down, Depressed, Hopeless 0 0 0 0  PHQ - 2 Score 0 0 0 0    Relevant past medical, surgical, family and social history reviewed Past Medical History:  Diagnosis Date  . Arthritis   . Carotid atherosclerosis 12/28/2015  . Do not resuscitate status 01/16/2017   Discussed with patient, has living will  . Elevated PSA   . Giant cell arteritis ([place]) 12/28/2015  . Kidney stones   . PIN III (prostatic intraepithelial neoplasia III) 12/28/2015  . Skin cancer 05/31/2017   side of nose   Past Surgical History:  Procedure Laterality Date  . ARTERY BIOPSY Left 02/18/2015   Procedure: BIOPSY TEMPORAL ARTERY;  Surgeon: Algernon Huxley, MD;  Location: [facility] ORS;  Service: Vascular;  Laterality: Left;  . CYSTOSCOPY     Dr. Jacqlyn Larsen, urologist  . KIDNEY SURGERY      congential defect  . MOHS SURGERY Left    left nasolabial fold  . PROSTATE BIOPSY  2011, 2009   Dr. Jacqlyn Larsen, urologist   Family History  Problem Relation Age of Onset  . Parkinson's disease Mother   . Heart attack Father   . Heart disease Father   . Thyroid disease Daughter   . Stroke Maternal Grandmother   . Heart disease Maternal Grandmother   . Cancer Paternal Grandfather        unknown   Social History   Tobacco Use  . Smoking status: Former Smoker    Packs/day: 2.00    Last attempt to quit: 02/16/1977    Years since quitting: 40.3  . Smokeless tobacco: Never Used  Substance Use Topics  . Alcohol use: No  . Drug use: No   Interim medical history since last visit reviewed. Allergies and medications reviewed  Review of Systems Per HPI unless specifically indicated above     Objective:    BP 120/72   Pulse 76   Temp 97.6 F (36.4 C) (Oral)   Resp 16   Ht 5' 8.05" (1.728 m)   Wt 152 lb 11.2 oz (69.3 kg)   SpO2 97%   BMI 23.18 kg/m  Wt Readings from Last 3 Encounters:  06/03/17 152 lb 11.2 oz (69.3 kg)  01/16/17 149 lb 9.6 oz (67.9 kg)  07/10/16 156 lb (70.8 kg)    Physical Exam  Constitutional: He appears well-developed and well-nourished. No distress.  Eyes: No scleral icterus.  Cardiovascular: Normal rate and regular rhythm.  Pulmonary/Chest: Effort normal and breath sounds normal.  Genitourinary: Rectal exam shows no external hemorrhoid, no internal hemorrhoid, no fissure, no mass, no tenderness, anal tone normal and guaiac negative stool. Prostate is enlarged (mildly enlarged, but the right side seems larger and firmer than the left on DRE). Prostate is not tender.  Neurological: He is alert.  Skin: No pallor.  Surgical site LEFT side nasolabial fold, no drainage, no erythema  Psychiatric: He has a normal mood and affect.    Results for orders placed or performed in visit on 06/03/17  POC Hemoccult Bld/Stl (3-Cd Home Screen)  Result Value Ref  Range   Card #1 Date [id number]    Fecal Occult Blood, POC Negative Negative   Card #2 Date     Card #2 Fecal Occult Blod, POC     Card #3 Date     Card #3 Fecal Occult Blood, POC        Assessment & Plan:   Problem List Items Addressed This Visit      Cardiovascular and Mediastinum   Giant cell arteritis ([place])    Still on prednisone, managed by rheum        Genitourinary   PIN III (prostatic intraepithelial neoplasia III) - Primary    Check PSA today; blood drawn PRIOR to DRE; prostate feels somewhat asymmetric, and will notify the urologist about this to see how this compares to previous visit; refill the finasteride as urologist was doing at patient's request      Relevant Orders   PSA     Other   Status post Mohs surgery for basal cell carcinoma    Appears to be healing appropriately      Medication monitoring encounter    Check glucose on the prednisone      Relevant Orders   Basic metabolic panel   Elevated PSA    Check PSA and send to urologist at patient's request; blood was drawn PRIOR to the DRE      Relevant Orders   PSA    Other Visit Diagnoses    Encounter for Hemoccult screening       Relevant Orders   POC Hemoccult Bld/Stl (3-Cd Home Screen) (Completed)       Follow up plan: No Follow-up on file.  An after-visit summary was printed and given to the patient at [place].  Please see the patient instructions which may contain other information and recommendations beyond what is mentioned above in the assessment and plan.  Meds ordered this encounter  Medications  . finasteride (PROSCAR) 5 MG tablet    Sig: Take 1 tablet (5 mg total) by mouth daily.    Dispense:  90 tablet    Refill:  3    Orders Placed This Encounter  Procedures  . PSA  . Basic metabolic panel  . POC Hemoccult Bld/Stl (3-Cd Home Screen)

## 2017-06-03 NOTE — Assessment & Plan Note (Addendum)
Check PSA and send to urologist at patient's request; blood was drawn PRIOR to the DRE

## 2017-06-03 NOTE — Assessment & Plan Note (Addendum)
Check PSA today; blood drawn PRIOR to DRE; prostate feels somewhat asymmetric, and will notify the urologist about this to see how this compares to previous visit; refill the finasteride as urologist was doing at patient's request

## 2017-06-03 NOTE — Assessment & Plan Note (Signed)
Check glucose on the prednisone

## 2017-06-04 LAB — BASIC METABOLIC PANEL
BUN: 16 mg/dL (ref 7–25)
CALCIUM: 9.4 mg/dL (ref 8.6–10.3)
CHLORIDE: 100 mmol/L (ref 98–110)
CO2: 34 mmol/L — AB (ref 20–32)
Creat: 0.9 mg/dL (ref 0.70–1.18)
Glucose, Bld: 88 mg/dL (ref 65–99)
POTASSIUM: 4.2 mmol/L (ref 3.5–5.3)
SODIUM: 139 mmol/L (ref 135–146)

## 2017-06-04 LAB — PSA: PSA: 1.2 ng/mL (ref <=4.0)

## 2017-07-17 DIAGNOSIS — M316 Other giant cell arteritis: Secondary | ICD-10-CM | POA: Diagnosis not present

## 2017-07-29 DIAGNOSIS — M810 Age-related osteoporosis without current pathological fracture: Secondary | ICD-10-CM | POA: Diagnosis not present

## 2017-07-29 DIAGNOSIS — M545 Low back pain: Secondary | ICD-10-CM | POA: Diagnosis not present

## 2017-07-29 DIAGNOSIS — M316 Other giant cell arteritis: Secondary | ICD-10-CM | POA: Diagnosis not present

## 2017-07-29 DIAGNOSIS — G8929 Other chronic pain: Secondary | ICD-10-CM | POA: Diagnosis not present

## 2017-07-29 DIAGNOSIS — M542 Cervicalgia: Secondary | ICD-10-CM | POA: Diagnosis not present

## 2017-08-14 DIAGNOSIS — M316 Other giant cell arteritis: Secondary | ICD-10-CM | POA: Diagnosis not present

## 2017-08-24 NOTE — Progress Notes (Signed)
Signing off on old documentation note from Jan 2018

## 2017-08-24 NOTE — Progress Notes (Signed)
Closing out lab/order note open since:  12/28/15

## 2017-09-10 ENCOUNTER — Other Ambulatory Visit (INDEPENDENT_AMBULATORY_CARE_PROVIDER_SITE_OTHER): Payer: Self-pay | Admitting: Vascular Surgery

## 2017-09-10 DIAGNOSIS — I779 Disorder of arteries and arterioles, unspecified: Secondary | ICD-10-CM

## 2017-09-10 DIAGNOSIS — I739 Peripheral vascular disease, unspecified: Principal | ICD-10-CM

## 2017-09-11 DIAGNOSIS — M316 Other giant cell arteritis: Secondary | ICD-10-CM | POA: Diagnosis not present

## 2017-09-12 ENCOUNTER — Other Ambulatory Visit (INDEPENDENT_AMBULATORY_CARE_PROVIDER_SITE_OTHER): Payer: Self-pay | Admitting: Vascular Surgery

## 2017-09-12 ENCOUNTER — Ambulatory Visit (INDEPENDENT_AMBULATORY_CARE_PROVIDER_SITE_OTHER): Payer: PPO

## 2017-09-12 ENCOUNTER — Ambulatory Visit (INDEPENDENT_AMBULATORY_CARE_PROVIDER_SITE_OTHER): Payer: PPO | Admitting: Vascular Surgery

## 2017-09-12 ENCOUNTER — Encounter (INDEPENDENT_AMBULATORY_CARE_PROVIDER_SITE_OTHER): Payer: Self-pay | Admitting: Vascular Surgery

## 2017-09-12 VITALS — BP 140/70 | HR 56 | Resp 16 | Ht 70.0 in | Wt 153.0 lb

## 2017-09-12 DIAGNOSIS — I6523 Occlusion and stenosis of bilateral carotid arteries: Secondary | ICD-10-CM | POA: Diagnosis not present

## 2017-09-12 DIAGNOSIS — M316 Other giant cell arteritis: Secondary | ICD-10-CM

## 2017-09-12 NOTE — Progress Notes (Signed)
Subjective:    Patient ID: Jackson Coffey, male    DOB: 1941/06/03, 77 y.o.   MRN: [id number] Chief Complaint  Patient presents with  . Carotid    1 year Carotid check   The patient presents for a yearly carotid artery stenosis follow-up. The stenosis has been followed by surveillance duplexes. The patient underwent a bilateral carotid duplex scan which showed no change from the previous exam one year ago. Duplex is stable at 1-39% ICA stenosis bilaterally.  Both vertebral arteries are patent with antegrade flow.  Normal flow hemodynamics were seen in the bilateral subclavian arteries. The patient denies experiencing Amaurosis Fugax, TIA like symptoms or focal motor deficits.  The patient denies any fever, nausea vomiting.    Review of Systems  Constitutional: Negative.   HENT: Negative.   Eyes: Negative.   Respiratory: Negative.   Cardiovascular: Negative.   Gastrointestinal: Negative.   Endocrine: Negative.   Genitourinary: Negative.   Musculoskeletal: Negative.   Skin: Negative.   Allergic/Immunologic: Negative.   Neurological: Negative.   Hematological: Negative.   Psychiatric/Behavioral: Negative.       Objective:   Physical Exam  Constitutional: He is oriented to person, place, and time. He appears well-developed and well-nourished. No distress.  HENT:  Head: Normocephalic and atraumatic.  Eyes: Conjunctivae are normal. Pupils are equal, round, and reactive to light.  Neck: Normal range of motion.  Carotid bruits noted on exam  Cardiovascular: Normal rate, regular rhythm, normal heart sounds and intact distal pulses.  Pulses:      Radial pulses are 2+ on the right side, and 2+ on the left side.  Pulmonary/Chest: Effort normal and breath sounds normal.  Musculoskeletal: Normal range of motion. He exhibits no edema.  Neurological: He is alert and oriented to person, place, and time.  Skin: Skin is warm and dry. He is not diaphoretic.  Psychiatric: He has a  normal mood and affect. His behavior is normal. Judgment and thought content normal.  Vitals reviewed.  BP 140/70 (BP Location: Left Arm, Patient Position: Sitting)   Pulse (!) 56   Resp 16   Ht 5\' 10"  (1.778 m)   Wt 153 lb (69.4 kg)   BMI 21.95 kg/m   Past Medical History:  Diagnosis Date  . Arthritis   . Carotid atherosclerosis 12/28/2015  . Do not resuscitate status 01/16/2017   Discussed with patient, has living will  . Elevated PSA   . Giant cell arteritis ([place]) 12/28/2015  . Kidney stones   . PIN III (prostatic intraepithelial neoplasia III) 12/28/2015  . Skin cancer 05/31/2017   side of nose   Social History   Socioeconomic History  . Marital status: Married    Spouse name: Not on file  . Number of children: Not on file  . Years of education: Not on file  . Highest education level: Not on file  Social Needs  . Financial resource strain: Not on file  . Food insecurity - worry: Not on file  . Food insecurity - inability: Not on file  . Transportation needs - medical: Not on file  . Transportation needs - non-medical: Not on file  Occupational History  . Not on file  Tobacco Use  . Smoking status: Former Smoker    Packs/day: 2.00    Last attempt to quit: 02/16/1977    Years since quitting: 40.5  . Smokeless tobacco: Never Used  Substance and Sexual Activity  . Alcohol use: No  . Drug use: No  .  Sexual activity: Not Currently  Other Topics Concern  . Not on file  Social History Narrative  . Not on file   Past Surgical History:  Procedure Laterality Date  . ARTERY BIOPSY Left 02/18/2015   Procedure: BIOPSY TEMPORAL ARTERY;  Surgeon: Algernon Huxley, MD;  Location: [facility] ORS;  Service: Vascular;  Laterality: Left;  . CYSTOSCOPY     Dr. Jacqlyn Larsen, urologist  . KIDNEY SURGERY     congential defect  . MOHS SURGERY Left    left nasolabial fold  . PROSTATE BIOPSY  2011, 2009   Dr. Jacqlyn Larsen, urologist   Family History  Problem Relation Age of Onset  . Parkinson's disease  Mother   . Heart attack Father   . Heart disease Father   . Thyroid disease Daughter   . Stroke Maternal Grandmother   . Heart disease Maternal Grandmother   . Cancer Paternal Grandfather        unknown   Allergies  Allergen Reactions  . Geocillin [Carbenicillin] Swelling  . Septra [Sulfamethoxazole-Trimethoprim] Swelling  . Tetracyclines & Related Swelling      Assessment & Plan:  The patient presents for a yearly carotid artery stenosis follow-up. The stenosis has been followed by surveillance duplexes. The patient underwent a bilateral carotid duplex scan which showed no change from the previous exam one year ago. Duplex is stable at 1-39% ICA stenosis bilaterally.  Both vertebral arteries are patent with antegrade flow.  Normal flow hemodynamics were seen in the bilateral subclavian arteries. The patient denies experiencing Amaurosis Fugax, TIA like symptoms or focal motor deficits.  The patient denies any fever, nausea vomiting.   1. Atherosclerosis of both carotid arteries - Stable Studies reviewed with patient. Patient asymptomatic with stable duplex.  No intervention at this time.  Patient to return in one year for surveillance carotid duplex. Patient to remain abstinent of tobacco use. I have discussed with the patient at length the risk factors for and pathogenesis of atherosclerotic disease and encouraged a healthy diet, regular exercise regimen and blood pressure / glucose control.  Patient was instructed to contact our office in the interim with problems such as arm / leg weakness or numbness, speech / swallowing difficulty or temporary monocular blindness. The patient expresses their understanding.   - VAS US CAROTID; Future  2. Giant cell arteritis ([facility]) - Stable Currently asymptomatic at this time Follow by rheumatology  Current Outpatient Medications on File Prior to Visit  Medication Sig Dispense Refill  . alendronate (FOSAMAX) 70 MG tablet Take 70 mg by mouth  once a week.    . Ascorbic Acid (VITAMIN C) 100 MG tablet Take 100 mg by mouth daily.    Marland Kitchen aspirin 81 MG tablet Take 81 mg by mouth daily.    . calcium carbonate (TUMS - DOSED IN MG ELEMENTAL CALCIUM) 500 MG chewable tablet Chew 1 tablet by mouth daily.    . finasteride (PROSCAR) 5 MG tablet Take 1 tablet (5 mg total) by mouth daily. 90 tablet 3  . Multiple Vitamin (MULTIVITAMIN) tablet Take 1 tablet by mouth daily.    Marland Kitchen omeprazole (PRILOSEC) 20 MG capsule Take 1 capsule (20 mg total) by mouth daily. 30 capsule 2  . predniSONE (DELTASONE) 5 MG tablet One pill by mouth every other day, alternating with half of a pill the other days     No current facility-administered medications on file prior to visit.    There are no Patient Instructions on file for this visit. No Follow-up  on file.  Mark Benecke A Buffi Ewton, PA-C

## 2017-10-16 DIAGNOSIS — M316 Other giant cell arteritis: Secondary | ICD-10-CM | POA: Diagnosis not present

## 2017-10-22 ENCOUNTER — Telehealth: Payer: Self-pay

## 2017-10-22 NOTE — Telephone Encounter (Signed)
I think it would be easiest if Dr. Jacqlyn Larsen orders the PSA and monitors that

## 2017-10-22 NOTE — Telephone Encounter (Signed)
Copied from [place] [id number]. Topic: Quick Communication - See Telephone Encounter >> Oct 21, 2017  3:17 PM Antonieta Iba C wrote: CRM for notification. See Telephone encounter for: 10/21/17.  Pt's spouse called in to schedule a PSA lab for November. Please advise if lab is needed.   Called pt, line busy.

## 2017-10-23 NOTE — Telephone Encounter (Signed)
Called pt's wife, line busy. No answer. CRM created

## 2017-10-28 ENCOUNTER — Telehealth: Payer: Self-pay

## 2017-10-28 NOTE — Telephone Encounter (Signed)
Please see my response to the phone call from 10/22/17

## 2017-10-28 NOTE — Telephone Encounter (Signed)
Copied from [place] [id number]. Topic: Quick Communication - See Telephone Encounter >> Oct 21, 2017  3:17 PM Antonieta Iba C wrote: CRM for notification. See Telephone encounter for: 10/21/17.  Pt's spouse called in to schedule a PSA lab for November. Please advise if lab is needed.  >> Oct 28, 2017 10:11 AM Bea Graff, NT wrote: Pts wife a calling to check status on getting a lab appt for her husband.

## 2017-10-28 NOTE — Telephone Encounter (Signed)
Pt husband and wife state they have spoke with you about this and you agreed to manage, due to Dr. Jacqlyn Larsen moving to [facility] and being to far away?

## 2017-10-28 NOTE — Telephone Encounter (Signed)
No, I'm going to ask them to see a urologist I'll be glad to refer to closer urologist; he is in [place]; long drive back and forth; it turns out that I thought Dr. Jacqlyn Larsen was still seeing him and we were just faxing him the labs; wife says that they quit going to see Dr. Jacqlyn Larsen I advised that with his diagnoses, he should be followed by a urologist His last PSA was 1.2, the best it's been in a while patient told her I offered to refer him to a urologist here in [place], literally 1/4 mile from our office; she declined and said she will talk to the patient and ask him what he wants to do Reviewed his last glucose (normal), and lipids (normal); medicare will not pay for those except every 3 and 5 years, respectively unless abnormal

## 2017-10-30 ENCOUNTER — Telehealth: Payer: Self-pay

## 2017-10-30 DIAGNOSIS — R972 Elevated prostate specific antigen [PSA]: Secondary | ICD-10-CM

## 2017-10-30 DIAGNOSIS — D075 Carcinoma in situ of prostate: Secondary | ICD-10-CM

## 2017-10-30 NOTE — Telephone Encounter (Signed)
Copied from [place] [id number]. Topic: Referral - Request >> Oct 29, 2017 11:55 AM Hewitt Shorts wrote: Pt is requesting a closer urologist referral Since Dr. Jacqlyn Larsen is relocating further out  Best number [phone]   I do not see that we have placed an urology referral for this patient.  Please advise.

## 2017-10-30 NOTE — Telephone Encounter (Signed)
Referral entered  

## 2017-11-13 DIAGNOSIS — M316 Other giant cell arteritis: Secondary | ICD-10-CM | POA: Diagnosis not present

## 2017-11-20 DIAGNOSIS — H25813 Combined forms of age-related cataract, bilateral: Secondary | ICD-10-CM | POA: Diagnosis not present

## 2017-12-26 DIAGNOSIS — M316 Other giant cell arteritis: Secondary | ICD-10-CM | POA: Diagnosis not present

## 2018-01-29 DIAGNOSIS — M316 Other giant cell arteritis: Secondary | ICD-10-CM | POA: Diagnosis not present

## 2018-01-31 ENCOUNTER — Telehealth: Payer: Self-pay | Admitting: Family Medicine

## 2018-01-31 NOTE — Telephone Encounter (Signed)
Left msg asking pt to call and schedule AWV w/ [facility]. Last AWV 01/16/17

## 2018-04-02 DIAGNOSIS — M316 Other giant cell arteritis: Secondary | ICD-10-CM | POA: Diagnosis not present

## 2018-04-30 ENCOUNTER — Ambulatory Visit (INDEPENDENT_AMBULATORY_CARE_PROVIDER_SITE_OTHER): Payer: PPO | Admitting: Family Medicine

## 2018-04-30 ENCOUNTER — Encounter: Payer: Self-pay | Admitting: Family Medicine

## 2018-04-30 VITALS — BP 110/78 | HR 95 | Temp 97.9°F | Resp 16 | Ht 70.0 in | Wt 150.5 lb

## 2018-04-30 DIAGNOSIS — R82998 Other abnormal findings in urine: Secondary | ICD-10-CM

## 2018-04-30 DIAGNOSIS — R300 Dysuria: Secondary | ICD-10-CM | POA: Diagnosis not present

## 2018-04-30 DIAGNOSIS — Z862 Personal history of diseases of the blood and blood-forming organs and certain disorders involving the immune mechanism: Secondary | ICD-10-CM

## 2018-04-30 DIAGNOSIS — D075 Carcinoma in situ of prostate: Secondary | ICD-10-CM

## 2018-04-30 DIAGNOSIS — R34 Anuria and oliguria: Secondary | ICD-10-CM | POA: Diagnosis not present

## 2018-04-30 MED ORDER — TAMSULOSIN HCL 0.4 MG PO CAPS: 0.4000 mg | ORAL_CAPSULE | Freq: Every day | ORAL | 0 refills | Status: DC

## 2018-04-30 NOTE — Progress Notes (Signed)
Name: Jackson Coffey   MRN: [id number]    DOB: January 18, 1941   Date:04/30/2018       Progress Note  Subjective  Chief Complaint  Chief Complaint  Patient presents with  . Medication Refill    refill flomax, cannot get appointment til December    HPI  Pt presents with concern for "prostate problems" - having intermittent burning for a few weeks to his pelvic and low back areas; endorses some dark urine but was taking AZO part of the time.  He has PIN III - used to see Dr. Jacqlyn Coffey, has appointment with Jackson Espy PA-C with [facility] in December, was unable to obtain an appointment any sooner. Taking finasteride daily.  He has had two biopsies and he states he was never told he had prostate cancer.  Was told by Dr. Jacqlyn Coffey in the past that if symptoms flare up he would be put on flomax. Denies abdominal pain, fevers/chills, nauesa, vomiting, no pain/difficulty with BM's. Has taken flomax in the past and did not have any allergic reaction to it.  Patient Active Problem List   Diagnosis Date Noted  . Status post Mohs surgery for basal cell carcinoma 06/03/2017  . Medication monitoring encounter 01/16/2017  . Do not resuscitate status 01/16/2017  . Preventative health care 01/16/2017  . Colon cancer screening 03/11/2016  . Giant cell arteritis ([place]) 12/28/2015  . Elevated PSA 12/28/2015  . Carotid atherosclerosis 12/28/2015  . PIN III (prostatic intraepithelial neoplasia III) 12/28/2015    Social History   Tobacco Use  . Smoking status: Former Smoker    Packs/day: 2.00    Last attempt to quit: 02/16/1977    Years since quitting: 41.2  . Smokeless tobacco: Never Used  Substance Use Topics  . Alcohol use: No     Current Outpatient Medications:  .  Ascorbic Acid (VITAMIN C) 100 MG tablet, Take 100 mg by mouth daily., Disp: , Rfl:  .  aspirin 81 MG tablet, Take 81 mg by mouth daily., Disp: , Rfl:  .  calcium carbonate (TUMS - DOSED IN MG ELEMENTAL CALCIUM) 500 MG  chewable tablet, Chew 1 tablet by mouth daily., Disp: , Rfl:  .  finasteride (PROSCAR) 5 MG tablet, Take 1 tablet (5 mg total) by mouth daily., Disp: 90 tablet, Rfl: 3 .  Multiple Vitamin (MULTIVITAMIN) tablet, Take 1 tablet by mouth daily., Disp: , Rfl:  .  omeprazole (PRILOSEC) 20 MG capsule, Take 1 capsule (20 mg total) by mouth daily., Disp: 30 capsule, Rfl: 2 .  predniSONE (DELTASONE) 5 MG tablet, One pill by mouth every other day, alternating with half of a pill the other days, Disp: , Rfl:  .  alendronate (FOSAMAX) 70 MG tablet, Take 70 mg by mouth once a week., Disp: , Rfl:   Allergies  Allergen Reactions  . Geocillin [Carbenicillin] Swelling  . Septra [Sulfamethoxazole-Trimethoprim] Swelling  . Tetracyclines & Related Swelling    I personally reviewed active problem list, medication list, allergies, notes from last encounter, lab results with the patient/caregiver today.  ROS  Ten systems reviewed and is negative except as mentioned in HPI  Objective  Vitals:   04/30/18 0847  BP: 110/78  Pulse: 95  Resp: 16  Temp: 97.9 F (36.6 C)  TempSrc: Oral  SpO2: 95%  Weight: 150 lb 8 oz (68.3 kg)  Height: 5\' 10"  (1.778 m)   Body mass index is 21.59 kg/m.  Nursing Note and Vital Signs reviewed.  Physical Exam  Constitutional:  Patient appears well-developed and well-nourished. No distress.  HENT: Head: Normocephalic and atraumatic. Ears: B TMs ok, no erythema or effusion; Nose: Nose normal. Mouth/Throat: Oropharynx is clear and moist. No oropharyngeal exudate.  Eyes: Conjunctivae and EOM are normal. Pupils are equal, round, and reactive to light. No scleral icterus.  Neck: Normal range of motion. Neck supple. No JVD present. No thyromegaly present.  Cardiovascular: Normal rate, regular rhythm and normal heart sounds.  No murmur heard. No BLE edema. Pulmonary/Chest: Effort normal and breath sounds normal. No respiratory distress. Abdominal: Soft. Bowel sounds are normal, no  distension. There is no tenderness. no masses MALE GENITALIA: Deferred RECTAL: Prostate normal size and consistency with no tenderness, no rectal masses or hemorrhoids; rectal examination performed after PSA draw. Musculoskeletal: Normal range of motion, no joint effusions. No gross deformities Neurological: he is alert and oriented to person, place, and time. No cranial nerve deficit. Coordination, balance, strength, speech and gait are normal.  Skin: Skin is warm and dry. No rash noted. No erythema.  Psychiatric: Patient has a normal mood and affect. behavior is normal. Judgment and thought content normal.  No results found for this or any previous visit (from the past 72 hour(s)).  Assessment & Plan  1. Dysuria - Urine Culture - Urinalysis, microscopic only - COMPLETE METABOLIC PANEL WITH GFR - CBC w/Diff/Platelet  2. Dark urine - Urine Culture - Urinalysis, microscopic only - COMPLETE METABOLIC PANEL WITH GFR - CBC w/Diff/Platelet  3. Decreased urination - Urine Culture - Urinalysis, microscopic only - COMPLETE METABOLIC PANEL WITH GFR  4. PIN III (prostatic intraepithelial neoplasia III) - tamsulosin (FLOMAX) 0.4 MG CAPS capsule; Take 1 capsule (0.4 mg total) by mouth daily.  Dispense: 30 capsule; Refill: 0 - PSA - performed prior to rectal examination - Keep appt with urology in [facility] 2019  5. History of anemia - CBC w/Diff/Platelet  - Discussed case with PCP Dr. Sanda Klein who is in agreement with the plan of care.  We will provide flomax today, and check for infection through labs.  UA dip in office unable to be read due to discoloration from pt taking AZO last night. -Red flags and when to present for emergency care or RTC including fever >101.54F, chest pain, shortness of breath, new/worsening/un-resolving symptoms, flank pain, abdominal pain, N/V, fatigue/malaise reviewed with patient at time of visit. Follow up and care instructions discussed and provided in AVS.

## 2018-05-01 LAB — COMPLETE METABOLIC PANEL WITH GFR
AG RATIO: 1.8 (calc) (ref 1.0–2.5)
ALT: 12 U/L (ref 9–46)
AST: 19 U/L (ref 10–35)
Albumin: 4.2 g/dL (ref 3.6–5.1)
Alkaline phosphatase (APISO): 82 U/L (ref 40–115)
BUN: 19 mg/dL (ref 7–25)
CALCIUM: 9.4 mg/dL (ref 8.6–10.3)
CO2: 30 mmol/L (ref 20–32)
CREATININE: 0.93 mg/dL (ref 0.70–1.18)
Chloride: 103 mmol/L (ref 98–110)
GFR, EST AFRICAN AMERICAN: 91 mL/min/{1.73_m2} (ref >=60)
GFR, EST NON AFRICAN AMERICAN: 79 mL/min/{1.73_m2} (ref >=60)
Globulin: 2.3 g/dL (calc) (ref 1.9–3.7)
Glucose, Bld: 75 mg/dL (ref 65–139)
POTASSIUM: 4.1 mmol/L (ref 3.5–5.3)
Sodium: 139 mmol/L (ref 135–146)
TOTAL PROTEIN: 6.5 g/dL (ref 6.1–8.1)
Total Bilirubin: 0.6 mg/dL (ref 0.2–1.2)

## 2018-05-01 LAB — URINALYSIS, MICROSCOPIC ONLY
BACTERIA UA: NONE SEEN /HPF
HYALINE CAST: NONE SEEN /LPF
SQUAMOUS EPITHELIAL / LPF: NONE SEEN /HPF (ref <=5)
WBC, UA: NONE SEEN /HPF (ref 0–5)

## 2018-05-01 LAB — CBC WITH DIFFERENTIAL/PLATELET
BASOS ABS: 29 {cells}/uL (ref 0–200)
Basophils Relative: 0.4 %
Eosinophils Absolute: 197 cells/uL (ref 15–500)
Eosinophils Relative: 2.7 %
HCT: 39.4 % (ref 38.5–50.0)
Hemoglobin: 13.6 g/dL (ref 13.2–17.1)
LYMPHS ABS: 971 {cells}/uL (ref 850–3900)
MCH: 32 pg (ref 27.0–33.0)
MCHC: 34.5 g/dL (ref 32.0–36.0)
MCV: 92.7 fL (ref 80.0–100.0)
MPV: 10.7 fL (ref 7.5–12.5)
Monocytes Relative: 7.7 %
NEUTROS PCT: 75.9 %
Neutro Abs: 5541 cells/uL (ref 1500–7800)
Platelets: 268 10*3/uL (ref 140–400)
RBC: 4.25 10*6/uL (ref 4.20–5.80)
RDW: 11.7 % (ref 11.0–15.0)
Total Lymphocyte: 13.3 %
WBC: 7.3 10*3/uL (ref 3.8–10.8)
WBCMIX: 562 {cells}/uL (ref 200–950)

## 2018-05-01 LAB — URINE CULTURE
MICRO NUMBER: 91274867
Result:: NO GROWTH
SPECIMEN QUALITY:: ADEQUATE

## 2018-05-01 LAB — PSA: PSA: 1.2 ng/mL (ref <=4.0)

## 2018-05-28 DIAGNOSIS — M316 Other giant cell arteritis: Secondary | ICD-10-CM | POA: Diagnosis not present

## 2018-06-04 ENCOUNTER — Other Ambulatory Visit: Payer: Self-pay

## 2018-06-04 DIAGNOSIS — C61 Malignant neoplasm of prostate: Secondary | ICD-10-CM

## 2018-06-09 ENCOUNTER — Other Ambulatory Visit: Payer: PPO

## 2018-06-09 DIAGNOSIS — C61 Malignant neoplasm of prostate: Secondary | ICD-10-CM

## 2018-06-10 ENCOUNTER — Ambulatory Visit: Payer: PPO | Admitting: Urology

## 2018-06-10 ENCOUNTER — Encounter: Payer: Self-pay | Admitting: Urology

## 2018-06-10 VITALS — BP 148/72 | HR 64 | Ht 70.0 in | Wt 152.0 lb

## 2018-06-10 DIAGNOSIS — Z87442 Personal history of urinary calculi: Secondary | ICD-10-CM | POA: Diagnosis not present

## 2018-06-10 DIAGNOSIS — Z87898 Personal history of other specified conditions: Secondary | ICD-10-CM | POA: Diagnosis not present

## 2018-06-10 DIAGNOSIS — N5203 Combined arterial insufficiency and corporo-venous occlusive erectile dysfunction: Secondary | ICD-10-CM

## 2018-06-10 DIAGNOSIS — N400 Enlarged prostate without lower urinary tract symptoms: Secondary | ICD-10-CM

## 2018-06-10 DIAGNOSIS — C61 Malignant neoplasm of prostate: Secondary | ICD-10-CM | POA: Diagnosis not present

## 2018-06-10 LAB — BLADDER SCAN AMB NON-IMAGING

## 2018-06-10 MED ORDER — FINASTERIDE 5 MG PO TABS: 5.0000 mg | ORAL_TABLET | Freq: Every day | ORAL | 3 refills | Status: DC

## 2018-06-10 NOTE — Progress Notes (Signed)
06/10/2018 11:41 AM   Jackson Coffey [name] 1940/11/23 [id number]  Referring provider: Arnetha Courser, MD [address] [place], [place] [address]  Chief Complaint  Patient presents with  . New Patient (Initial Visit)    HPI: 77 year old male previous followed by Dr. Jacqlyn Larsen who presents today to establish care.  He has a personal history of multiple GU issues including history of elevated PSA, personal diagnosis of high-grade PIN, history or prostatitis, BPH with urinary obstruction, history of kidney stones, dysfunction and Peyronie's disease.  He has a personal history of elevated PSA.  He underwent prostate biopsy in 2009 at which time his prostate volume was noted to be 29 cc.  This showed evidence of high-grade PIN and a single focus as well as a single focus of abnormal "suspicious cells".  His most recent PSA on 04/30/2018 was 1.2.which has been stably low.  He is chronically on finasteride presumably for chemoprevention.    He has a personal history of kidney stone.  He believes he may have passed  A stone recently.  Prior to that, it was at least a few years ago.  He has never had to have surgery for stones.  Currently asymptotic.    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 Ernst Spell.    Personal history of ED previously on Viagra.  He uses a VED which works well.    IPSS    Row Name 06/10/18 1500         International Prostate Symptom Score   How often have you had the sensation of not emptying your bladder?  Not at All     How often have you had to urinate less than every two hours?  Not at All     How often have you found you stopped and started again several times when you urinated?  Less than 1 in 5 times     How often have you found it difficult to postpone urination?  Not at All     How often have you had a weak urinary stream?  Less than 1 in 5 times     How often have you had to strain to start urination?   Not at All     How many times did you typically get up at night to urinate?  1 Time     Total IPSS Score  3       Quality of Life due to urinary symptoms   If you were to spend the rest of your life with your urinary condition just the way it is now how would you feel about that?  Mostly Satisfied        Score:  1-7 Mild 8-19 Moderate 20-35 Severe  PMH: Past Medical History:  Diagnosis Date  . Arthritis   . Carotid atherosclerosis 12/28/2015  . Do not resuscitate status 01/16/2017   Discussed with patient, has living will  . Elevated PSA   . Giant cell arteritis ([place]) 12/28/2015  . Kidney stones   . PIN III (prostatic intraepithelial neoplasia III) 12/28/2015  . Skin cancer 05/31/2017   side of nose    Surgical History: Past Surgical History:  Procedure Laterality Date  . ARTERY BIOPSY Left 02/18/2015   Procedure: BIOPSY TEMPORAL ARTERY;  Surgeon: Algernon Huxley, MD;  Location: [facility] ORS;  Service: Vascular;  Laterality: Left;  . CYSTOSCOPY     Dr. Jacqlyn Larsen, urologist  . KIDNEY SURGERY  congential defect  . MOHS SURGERY Left    left nasolabial fold  . PROSTATE BIOPSY  2011, 2009   Dr. Jacqlyn Larsen, urologist    Home Medications:  Allergies as of 06/10/2018      Reactions   Geocillin [carbenicillin] Swelling   Septra [sulfamethoxazole-trimethoprim] Swelling   Tetracyclines & Related Swelling      Medication List        Accurate as of 06/10/18 11:59 PM. Always use your most recent med list.          alendronate 70 MG tablet Commonly known as:  FOSAMAX Take 70 mg by mouth once a week.   aspirin 81 MG tablet Take 81 mg by mouth daily.   calcium carbonate 500 MG chewable tablet Commonly known as:  TUMS - dosed in mg elemental calcium Chew 1 tablet by mouth daily.   finasteride 5 MG tablet Commonly known as:  PROSCAR Take 1 tablet (5 mg total) by mouth daily.   multivitamin tablet Take 1 tablet by mouth daily.   omeprazole 20 MG capsule Commonly known as:   PRILOSEC Take 1 capsule (20 mg total) by mouth daily.   predniSONE 5 MG tablet Commonly known as:  DELTASONE One pill by mouth every other day, alternating with half of a pill the other days   vitamin C 100 MG tablet Take 100 mg by mouth daily.       Allergies:  Allergies  Allergen Reactions  . Geocillin [Carbenicillin] Swelling  . Septra [Sulfamethoxazole-Trimethoprim] Swelling  . Tetracyclines & Related Swelling    Family History: Family History  Problem Relation Age of Onset  . Parkinson's disease Mother   . Heart attack Father   . Heart disease Father   . Thyroid disease Daughter   . Stroke Maternal Grandmother   . Heart disease Maternal Grandmother   . Cancer Paternal Grandfather        unknown  . Prostate cancer Paternal Grandfather     Social History:  reports that he quit smoking about 41 years ago. He smoked 2.00 packs per day. He has never used smokeless tobacco. He reports that he does not drink alcohol or use drugs.  ROS: UROLOGY Frequent Urination?: No Hard to postpone urination?: No Burning/pain with urination?: No Get up at night to urinate?: Yes Leakage of urine?: No Urine stream starts and stops?: No Trouble starting stream?: No Do you have to strain to urinate?: No Blood in urine?: No Urinary tract infection?: No Sexually transmitted disease?: No Injury to kidneys or bladder?: No Painful intercourse?: No Weak stream?: No Erection problems?: No Penile pain?: No  Gastrointestinal Nausea?: No Vomiting?: No Indigestion/heartburn?: Yes Diarrhea?: No Constipation?: No  Constitutional Fever: No Night sweats?: No Weight loss?: Yes Fatigue?: No  Skin Skin rash/lesions?: Yes Itching?: Yes  Eyes Blurred vision?: Yes Double vision?: Yes  Ears/Nose/Throat Sore throat?: No Sinus problems?: Yes  Hematologic/Lymphatic Swollen glands?: No Easy bruising?: No  Cardiovascular Leg swelling?: No Chest pain?: No  Respiratory Cough?:  No Shortness of breath?: No  Endocrine Excessive thirst?: No  Musculoskeletal Back pain?: Yes Joint pain?: Yes  Neurological Headaches?: No Dizziness?: No  Psychologic Depression?: No Anxiety?: No  Physical Exam: BP (!) 148/72   Pulse 64   Ht 5\' 10"  (1.778 m)   Wt 152 lb (68.9 kg)   BMI 21.81 kg/m   Constitutional:  Alert and oriented, No acute distress. HEENT: [place] AT, moist mucus membranes.  Trachea midline, no masses. Cardiovascular: No clubbing, cyanosis, or  edema. Respiratory: Normal respiratory effort, no increased work of breathing. GI: Abdomen is soft, nontender, nondistended, no abdominal masses GU: No CVA tenderness. Skin: No rashes, bruises or suspicious lesions. Neurologic: Grossly intact, no focal deficits, moving all 4 extremities. Psychiatric: Normal mood and affect.  Laboratory Data: Lab Results  Component Value Date   WBC 7.3 04/30/2018   HGB 13.6 04/30/2018   HCT 39.4 04/30/2018   MCV 92.7 04/30/2018   PLT 268 04/30/2018    Lab Results  Component Value Date   CREATININE 0.93 04/30/2018    Lab Results  Component Value Date   PSA 1.2 04/30/2018   PSA 1.2 06/03/2017    Lab Results  Component Value Date   HGBA1C 5.1 01/16/2017     Pertinent Imaging: Results for orders placed or performed in visit on 06/10/18  BLADDER SCAN AMB NON-IMAGING  Result Value Ref Range   Scan Result 25ml   .  Assessment & Plan:    1. History of elevated PSA Personal history of elevated PSA has been on chronic finasteride presumably for chemoprevention for numerous years We discussed alternatives including discontinuation of finasteride with anticipation that PSA would likely double due to finasteride effect At this point time, he feels quite comfortable on finasteride and is hesitant to stop As such, we will continue this medication given that its low risk Given his age and stably low PSA, no indication for rectal exam today - BLADDER SCAN AMB  NON-IMAGING  2. History of kidney stones Personal history of kidney stones, currently asymptomatic Reviewed stone diet recommendations  3. Combined arterial insufficiency and corporo-venous occlusive erectile dysfunction Personal history of refractory erectile dysfunction, happy with the VED Alternatives were discussed  4. Benign prostatic hyperplasia without lower urinary tract symptoms Minimally symptomatic Continue finasteride as above   Return in about 1 year (around 06/11/2019) for with shannon for PSA/ IPSS.  He was given the option of following up as needed given that all of his symptoms and issues are stable, however he continues to desire routine annual follow-up as per above  Hollice Espy, MD  [address], [place], [place] [address] [phone]

## 2018-07-28 DIAGNOSIS — M542 Cervicalgia: Secondary | ICD-10-CM | POA: Diagnosis not present

## 2018-07-28 DIAGNOSIS — M810 Age-related osteoporosis without current pathological fracture: Secondary | ICD-10-CM | POA: Diagnosis not present

## 2018-07-28 DIAGNOSIS — M316 Other giant cell arteritis: Secondary | ICD-10-CM | POA: Diagnosis not present

## 2018-09-03 ENCOUNTER — Telehealth: Payer: Self-pay | Admitting: Family Medicine

## 2018-09-03 DIAGNOSIS — M316 Other giant cell arteritis: Secondary | ICD-10-CM | POA: Diagnosis not present

## 2018-09-03 NOTE — Telephone Encounter (Signed)
I called the patient to schedule AWV with Kasey.  His wife answered and said that he doesn't want to do the wellness visit. VDM (DD)

## 2018-09-15 ENCOUNTER — Ambulatory Visit (INDEPENDENT_AMBULATORY_CARE_PROVIDER_SITE_OTHER): Payer: PPO | Admitting: Vascular Surgery

## 2018-09-15 ENCOUNTER — Encounter (INDEPENDENT_AMBULATORY_CARE_PROVIDER_SITE_OTHER): Payer: PPO

## 2018-10-29 DIAGNOSIS — M316 Other giant cell arteritis: Secondary | ICD-10-CM | POA: Diagnosis not present

## 2018-10-30 ENCOUNTER — Encounter: Payer: Self-pay | Admitting: Family Medicine

## 2018-11-26 DIAGNOSIS — M316 Other giant cell arteritis: Secondary | ICD-10-CM | POA: Diagnosis not present

## 2018-12-31 DIAGNOSIS — M316 Other giant cell arteritis: Secondary | ICD-10-CM | POA: Diagnosis not present

## 2019-01-28 DIAGNOSIS — M316 Other giant cell arteritis: Secondary | ICD-10-CM | POA: Diagnosis not present

## 2019-03-04 DIAGNOSIS — M316 Other giant cell arteritis: Secondary | ICD-10-CM | POA: Diagnosis not present

## 2019-05-06 DIAGNOSIS — M316 Other giant cell arteritis: Secondary | ICD-10-CM | POA: Diagnosis not present

## 2019-05-20 ENCOUNTER — Other Ambulatory Visit: Payer: Self-pay | Admitting: Family Medicine

## 2019-05-20 DIAGNOSIS — Z87898 Personal history of other specified conditions: Secondary | ICD-10-CM

## 2019-06-08 ENCOUNTER — Other Ambulatory Visit: Payer: Self-pay

## 2019-06-08 ENCOUNTER — Other Ambulatory Visit: Payer: PPO

## 2019-06-08 DIAGNOSIS — Z87898 Personal history of other specified conditions: Secondary | ICD-10-CM

## 2019-06-09 LAB — PSA: Prostate Specific Ag, Serum: 1.9 ng/mL (ref 0.0–4.0)

## 2019-06-09 NOTE — Progress Notes (Signed)
06/10/2019 8:37 AM   Jackson Coffey September 22, 1940 [id number]  Referring provider: Arnetha Courser, MD [address] [place],  [place] [address]  Chief Complaint  Patient presents with  . Elevated PSA    HPI: 78 year old male previous followed by who presents today for a yearly visit.   He has a personal history of multiple GU issues including history of elevated PSA, personal diagnosis of high-grade PIN, history or prostatitis, BPH with urinary obstruction, history of kidney stones, dysfunction and Peyronie's disease.  He has a personal history of elevated PSA.  He underwent prostate biopsy in 2009 at which time his prostate volume was noted to be 29 cc.  This showed evidence of high-grade PIN and a single focus as well as a single focus of abnormal "suspicious cells".  His most recent PSA on 06/08/2019 was 1.9 (3.8), which has been stably low.  He is chronically on finasteride presumably for chemoprevention.  His only urinary complaint is nocturia x 1.  Patient denies any gross hematuria, dysuria or suprapubic/flank pain.  Patient denies any fevers, chills, nausea or vomiting.   He has a personal history of kidney stone.  The last time he passed a stone was several years ago.  Currently asymptotic.    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 Madelin Headings.    Personal history of ED previously on Viagra.  He uses a VED, but he is not very sexually active at this time.   I PSS score:  5/2  IPSS    Row Name 06/10/19 0800         International Prostate Symptom Score   How often have you had the sensation of not emptying your bladder?  Less than 1 in 5     How often have you had to urinate less than every two hours?  Not at All     How often have you found you stopped and started again several times when you urinated?  Less than 1 in 5 times     How often have you found it difficult to postpone urination?  Not at All     How  often have you had a weak urinary stream?  Less than 1 in 5 times     How often have you had to strain to start urination?  Less than 1 in 5 times     How many times did you typically get up at night to urinate?  1 Time     Total IPSS Score  5       Quality of Life due to urinary symptoms   If you were to spend the rest of your life with your urinary condition just the way it is now how would you feel about that?  Mostly Satisfied        Score:  1-7 Mild 8-19 Moderate 20-35 Severe  PMH: Past Medical History:  Diagnosis Date  . Arthritis   . Carotid atherosclerosis 12/28/2015  . Do not resuscitate status 01/16/2017   Discussed with patient, has living will  . Elevated PSA   . Giant cell arteritis ([place]) 12/28/2015  . Kidney stones   . PIN III (prostatic intraepithelial neoplasia III) 12/28/2015  . Skin cancer 05/31/2017   side of nose    Surgical History: Past Surgical History:  Procedure Laterality Date  . ARTERY BIOPSY Left 02/18/2015   Procedure: BIOPSY TEMPORAL ARTERY;  Surgeon: Algernon Huxley, MD;  Location: [facility] ORS;  Service: Vascular;  Laterality: Left;  . CYSTOSCOPY     Dr. Jacqlyn Larsen, urologist  . KIDNEY SURGERY     congential defect  . MOHS SURGERY Left    left nasolabial fold  . PROSTATE BIOPSY  2011, 2009   Dr. Jacqlyn Larsen, urologist    Home Medications:  Allergies as of 06/10/2019      Reactions   Geocillin [carbenicillin] Swelling   Septra [sulfamethoxazole-trimethoprim] Swelling   Tetracyclines & Related Swelling      Medication List       Accurate as of June 10, 2019  8:37 AM. If you have any questions, ask your nurse or doctor.        alendronate 70 MG tablet Commonly known as: FOSAMAX Take 70 mg by mouth once a week.   aspirin 81 MG tablet Take 81 mg by mouth daily.   calcium carbonate 500 MG chewable tablet Commonly known as: TUMS - dosed in mg elemental calcium Chew 1 tablet by mouth daily.   finasteride 5 MG tablet Commonly known as: PROSCAR  Take 1 tablet (5 mg total) by mouth daily.   multivitamin tablet Take 1 tablet by mouth daily.   omeprazole 20 MG capsule Commonly known as: PRILOSEC Take 1 capsule (20 mg total) by mouth daily.   predniSONE 5 MG tablet Commonly known as: DELTASONE One pill by mouth every other day, alternating with half of a pill the other days   vitamin C 100 MG tablet Take 100 mg by mouth daily.       Allergies:  Allergies  Allergen Reactions  . Geocillin [Carbenicillin] Swelling  . Septra [Sulfamethoxazole-Trimethoprim] Swelling  . Tetracyclines & Related Swelling    Family History: Family History  Problem Relation Age of Onset  . Parkinson's disease Mother   . Heart attack Father   . Heart disease Father   . Thyroid disease Daughter   . Stroke Maternal Grandmother   . Heart disease Maternal Grandmother   . Cancer Paternal Grandfather        unknown  . Prostate cancer Paternal Grandfather     Social History:  reports that he quit smoking about 42 years ago. He smoked 2.00 packs per day. He has never used smokeless tobacco. He reports that he does not drink alcohol or use drugs.  ROS: UROLOGY Frequent Urination?: No Hard to postpone urination?: No Burning/pain with urination?: No Get up at night to urinate?: No Leakage of urine?: No Urine stream starts and stops?: No Trouble starting stream?: No Do you have to strain to urinate?: No Blood in urine?: No Urinary tract infection?: No Sexually transmitted disease?: No Injury to kidneys or bladder?: No Painful intercourse?: No Weak stream?: No Erection problems?: No Penile pain?: No  Gastrointestinal Nausea?: No Vomiting?: No Indigestion/heartburn?: No Diarrhea?: No Constipation?: No  Constitutional Fever: No Night sweats?: No Weight loss?: No Fatigue?: No  Skin Skin rash/lesions?: No Itching?: No  Eyes Blurred vision?: No Double vision?: No  Ears/Nose/Throat Sore throat?: No Sinus problems?: No   Hematologic/Lymphatic Swollen glands?: No Easy bruising?: No  Cardiovascular Leg swelling?: No Chest pain?: No  Respiratory Cough?: No Shortness of breath?: No  Endocrine Excessive thirst?: No  Musculoskeletal Back pain?: No Joint pain?: No  Neurological Headaches?: No Dizziness?: No  Psychologic Depression?: No Anxiety?: No  Physical Exam: BP (!) 175/74   Pulse 67   Ht 5\' 10"  (1.778 m)   Wt 148 lb 12.8 oz (67.5 kg)   BMI 21.35  kg/m   Constitutional:  Well nourished. Alert and oriented, No acute distress. HEENT: [place] AT, moist mucus membranes.  Trachea midline, no masses. Cardiovascular: No clubbing, cyanosis, or edema. Respiratory: Normal respiratory effort, no increased work of breathing. GI: Abdomen is soft, non tender, non distended, no abdominal masses. Liver and spleen not palpable.  No hernias appreciated.  Stool sample for occult testing is not indicated.   GU: No CVA tenderness.  No bladder fullness or masses.   Neurologic: Grossly intact, no focal deficits, moving all 4 extremities. Psychiatric: Normal mood and affect.  Laboratory Data: Lab Results  Component Value Date   WBC 7.3 04/30/2018   HGB 13.6 04/30/2018   HCT 39.4 04/30/2018   MCV 92.7 04/30/2018   PLT 268 04/30/2018    Lab Results  Component Value Date   CREATININE 0.93 04/30/2018    Lab Results  Component Value Date   PSA 1.2 04/30/2018   PSA 1.2 06/03/2017    Lab Results  Component Value Date   HGBA1C 5.1 01/16/2017   Results for orders placed or performed in visit on 06/08/19  PSA  Result Value Ref Range   Prostate Specific Ag, Serum 1.9 0.0 - 4.0 ng/mL  . Pertinent imaging No recent imaging  Assessment & Plan:    1. History of elevated PSA Personal history of elevated PSA has been on chronic finasteride presumably for chemoprevention for numerous years Continue finasteride 5 mg daily - refill sent to [facility] his age and stably low PSA, no indication for  rectal exam today  2. History of kidney stones Personal history of kidney stones, currently asymptomatic  3. Combined arterial insufficiency and corporo-venous occlusive erectile dysfunction Not sexually active at this time  4. Benign prostatic hyperplasia without lower urinary tract symptoms Minimally symptomatic Continue finasteride as above   Return in about 1 year (around 06/09/2020) for IPSS, PSA and exam.    Zara Council, [facility]  [place] [address] [place], [place] [address] [phone]

## 2019-06-10 ENCOUNTER — Other Ambulatory Visit: Payer: Self-pay

## 2019-06-10 ENCOUNTER — Ambulatory Visit: Payer: PPO | Admitting: Urology

## 2019-06-10 ENCOUNTER — Encounter: Payer: Self-pay | Admitting: Urology

## 2019-06-10 VITALS — BP 175/74 | HR 67 | Ht 70.0 in | Wt 148.8 lb

## 2019-06-10 DIAGNOSIS — Z87898 Personal history of other specified conditions: Secondary | ICD-10-CM

## 2019-06-10 DIAGNOSIS — Z87442 Personal history of urinary calculi: Secondary | ICD-10-CM | POA: Diagnosis not present

## 2019-06-10 DIAGNOSIS — N400 Enlarged prostate without lower urinary tract symptoms: Secondary | ICD-10-CM | POA: Diagnosis not present

## 2019-06-10 DIAGNOSIS — N529 Male erectile dysfunction, unspecified: Secondary | ICD-10-CM

## 2019-06-10 MED ORDER — FINASTERIDE 5 MG PO TABS: 5.0000 mg | ORAL_TABLET | Freq: Every day | ORAL | 3 refills | Status: DC

## 2019-07-01 DIAGNOSIS — M316 Other giant cell arteritis: Secondary | ICD-10-CM | POA: Diagnosis not present

## 2019-09-02 DIAGNOSIS — M316 Other giant cell arteritis: Secondary | ICD-10-CM | POA: Diagnosis not present

## 2019-11-18 DIAGNOSIS — H25813 Combined forms of age-related cataract, bilateral: Secondary | ICD-10-CM | POA: Diagnosis not present

## 2019-12-02 DIAGNOSIS — M316 Other giant cell arteritis: Secondary | ICD-10-CM | POA: Diagnosis not present

## 2019-12-02 DIAGNOSIS — Z7952 Long term (current) use of systemic steroids: Secondary | ICD-10-CM | POA: Diagnosis not present

## 2019-12-21 DIAGNOSIS — M316 Other giant cell arteritis: Secondary | ICD-10-CM | POA: Diagnosis not present

## 2019-12-21 DIAGNOSIS — M542 Cervicalgia: Secondary | ICD-10-CM | POA: Diagnosis not present

## 2020-01-13 DIAGNOSIS — M542 Cervicalgia: Secondary | ICD-10-CM | POA: Diagnosis not present

## 2020-01-13 DIAGNOSIS — M316 Other giant cell arteritis: Secondary | ICD-10-CM | POA: Diagnosis not present

## 2020-01-18 DIAGNOSIS — M542 Cervicalgia: Secondary | ICD-10-CM | POA: Diagnosis not present

## 2020-01-18 DIAGNOSIS — M810 Age-related osteoporosis without current pathological fracture: Secondary | ICD-10-CM | POA: Diagnosis not present

## 2020-01-18 DIAGNOSIS — M316 Other giant cell arteritis: Secondary | ICD-10-CM | POA: Diagnosis not present

## 2020-02-10 ENCOUNTER — Encounter: Payer: Self-pay | Admitting: Family Medicine

## 2020-02-10 ENCOUNTER — Ambulatory Visit (INDEPENDENT_AMBULATORY_CARE_PROVIDER_SITE_OTHER): Payer: PPO | Admitting: Family Medicine

## 2020-02-10 ENCOUNTER — Other Ambulatory Visit: Payer: Self-pay

## 2020-02-10 VITALS — BP 114/70 | HR 68 | Temp 97.7°F | Resp 16 | Ht 70.0 in | Wt 145.6 lb

## 2020-02-10 DIAGNOSIS — R972 Elevated prostate specific antigen [PSA]: Secondary | ICD-10-CM

## 2020-02-10 DIAGNOSIS — I6523 Occlusion and stenosis of bilateral carotid arteries: Secondary | ICD-10-CM | POA: Diagnosis not present

## 2020-02-10 DIAGNOSIS — M316 Other giant cell arteritis: Secondary | ICD-10-CM

## 2020-02-10 DIAGNOSIS — D075 Carcinoma in situ of prostate: Secondary | ICD-10-CM

## 2020-02-10 DIAGNOSIS — M810 Age-related osteoporosis without current pathological fracture: Secondary | ICD-10-CM | POA: Diagnosis not present

## 2020-02-10 DIAGNOSIS — N400 Enlarged prostate without lower urinary tract symptoms: Secondary | ICD-10-CM | POA: Diagnosis not present

## 2020-02-10 DIAGNOSIS — Z79899 Other long term (current) drug therapy: Secondary | ICD-10-CM | POA: Diagnosis not present

## 2020-02-10 MED ORDER — PRILOSEC 20 MG PO CPDR: 20.0000 mg | DELAYED_RELEASE_CAPSULE | Freq: Every day | ORAL | 0 refills | Status: AC

## 2020-02-10 NOTE — Progress Notes (Signed)
Subjective:    Patient ID: Jackson Coffey, male    DOB: [birth year], 79 y.o.   MRN: [id number]  Jackson Coffey is a 79 y.o. male presenting on 02/10/2020 for No chief complaint on file.  Here with his wife Jackson Coffey  Previously followed by prior PCP [facility].  HPI   Specialist Rheumatology - Followed by Dr Jefm Bryant [facility])  Temporal Arteritis / Osteoporosis On chronic prednisone for past 5 years, initially high dose Prednisone 60mg  for weeks, had headache, had vision loss in past as well has regained, has adjusted dosing. He is alternating Prednisone 5mg  alternating with 2.5 mg dose. Last bone density 2018, DEXA, failed Fosamax, with myalgia and complication, then has been off this now, and has upcoming repeat DEXA, will consider injection such as Prolia after.  GERD Omeprazole 20mg  daily OTC to prevent side effect from chronic prednisone.  BPH / High Grade PIN / Elevated PSA / History of Kidney Stones Followed by Urology, at [facility]. Previously followed by Dr Jacqlyn Larsen in past. He has had imaging in past, unsure if they have passed or not some have spontaneously passed. History of kidney stones in past Past few years, has had elevated PSA. Had prostate biopsy multiple times, has had some High Grade PIN and pre-cancerous cells also suspicious abnormal cells. Had improved PSA On Finasteride doing well, chronically He is scheduled for yearly PSA and follow-up 11-06/2020 Admits nocturia x 1 Prior kidney surgery age 76, had a congenital Left UPJ artery crossing issue, prior Dr Madelin Headings  Elevated BP No known history of Hypertension. Has had occasional elevated reading. Often improves on repeat.  Carotid Atherosclerosis He is on ASA 81 Not on statin therapy Previously imaged, with history of Temporal arteritis  Former smoker Reports history of possible COPD. But he says this has not been confirmed and he has no problems with this. No prior imaging to  support.  Health Maintenance: UTD COVID19 vaccine.  Depression screen [facility] 2/9 02/10/2020 04/30/2018 06/03/2017  Decreased Interest 0 0 0  Down, Depressed, Hopeless 0 0 0  PHQ - 2 Score 0 0 0  Altered sleeping - 0 -  Tired, decreased energy - 0 -  Change in appetite - 0 -  Feeling bad or failure about yourself  - 0 -  Trouble concentrating - 0 -  Moving slowly or fidgety/restless - 0 -  Suicidal thoughts - 0 -  PHQ-9 Score - 0 -  Difficult doing work/chores - Not difficult at all -    Past Medical History:  Diagnosis Date  . Arthritis   . Carotid atherosclerosis 12/28/2015  . Do not resuscitate status 01/16/2017   Discussed with patient, has living will  . Elevated PSA   . Giant cell arteritis ([place]) 12/28/2015  . Kidney stones   . PIN III (prostatic intraepithelial neoplasia III) 12/28/2015  . Skin cancer 05/31/2017   side of nose   Past Surgical History:  Procedure Laterality Date  . ARTERY BIOPSY Left 02/18/2015   Procedure: BIOPSY TEMPORAL ARTERY;  Surgeon: Algernon Huxley, MD;  Location: [facility] ORS;  Service: Vascular;  Laterality: Left;  . CYSTOSCOPY     Dr. Jacqlyn Larsen, urologist  . KIDNEY SURGERY     congential defect  . MOHS SURGERY Left    left nasolabial fold  . PROSTATE BIOPSY  2011, 2009   Dr. Jacqlyn Larsen, urologist   Social History   Socioeconomic History  . Marital status: Married    Spouse name: Not on  file  . Number of children: Not on file  . Years of education: Not on file  . Highest education level: Not on file  Occupational History  . Not on file  Tobacco Use  . Smoking status: Former Smoker    Packs/day: 2.00    Quit date: 02/16/1977    Years since quitting: 43.0  . Smokeless tobacco: Never Used  Vaping Use  . Vaping Use: Never used  Substance and Sexual Activity  . Alcohol use: Not Currently  . Drug use: Never  . Sexual activity: Not Currently  Other Topics Concern  . Not on file  Social History Narrative  . Not on file   Social Determinants of Health    Financial Resource Strain:   . Difficulty of Paying Living Expenses:   Food Insecurity:   . Worried About Charity fundraiser in the Last Year:   . Arboriculturist in the Last Year:   Transportation Needs:   . Film/video editor (Medical):   Marland Kitchen Lack of Transportation (Non-Medical):   Physical Activity:   . Days of Exercise per Week:   . Minutes of Exercise per Session:   Stress:   . Feeling of Stress :   Social Connections:   . Frequency of Communication with Friends and Family:   . Frequency of Social Gatherings with Friends and Family:   . Attends Religious Services:   . Active Member of Clubs or Organizations:   . Attends Archivist Meetings:   Marland Kitchen Marital Status:   Intimate Partner Violence:   . Fear of Current or Ex-Partner:   . Emotionally Abused:   Marland Kitchen Physically Abused:   . Sexually Abused:    Family History  Problem Relation Age of Onset  . Parkinson's disease Mother   . Heart attack Father   . Heart disease Father   . Thyroid disease Daughter   . Stroke Maternal Grandmother   . Heart disease Maternal Grandmother   . Cancer Paternal Grandfather        unknown  . Prostate cancer Paternal Grandfather    Current Outpatient Medications on File Prior to Visit  Medication Sig  . Ascorbic Acid (VITAMIN C) 100 MG tablet Take 100 mg by mouth daily.  Marland Kitchen aspirin 81 MG tablet Take 81 mg by mouth daily.  . calcium carbonate (TUMS - DOSED IN MG ELEMENTAL CALCIUM) 500 MG chewable tablet Chew 1 tablet by mouth daily.  . finasteride (PROSCAR) 5 MG tablet Take 1 tablet (5 mg total) by mouth daily.  . Multiple Vitamin (MULTIVITAMIN) tablet Take 1 tablet by mouth daily.  . predniSONE (DELTASONE) 5 MG tablet One pill by mouth every other day, alternating with half of a pill the other days   No current facility-administered medications on file prior to visit.    Review of Systems Per HPI unless specifically indicated above    Objective:    BP 114/70   Pulse 68    Temp 97.7 F (36.5 C) (Temporal)   Resp 16   Ht 5\' 10"  (1.778 m)   Wt 145 lb 9.6 oz (66 kg)   SpO2 100%   BMI 20.89 kg/m   Wt Readings from Last 3 Encounters:  02/10/20 145 lb 9.6 oz (66 kg)  06/10/19 148 lb 12.8 oz (67.5 kg)  06/10/18 152 lb (68.9 kg)    Physical Exam Vitals and nursing note reviewed.  Constitutional:      General: He is not in acute distress.  Appearance: He is well-developed. He is not diaphoretic.     Comments: Well-appearing, comfortable, cooperative  HENT:     Head: Normocephalic and atraumatic.  Eyes:     General:        Right eye: No discharge.        Left eye: No discharge.     Conjunctiva/sclera: Conjunctivae normal.  Neck:     Thyroid: No thyromegaly.  Cardiovascular:     Rate and Rhythm: Normal rate and regular rhythm.     Heart sounds: Normal heart sounds. No murmur heard.   Pulmonary:     Effort: Pulmonary effort is normal. No respiratory distress.     Breath sounds: Normal breath sounds. No wheezing or rales.  Musculoskeletal:        General: Normal range of motion.     Cervical back: Normal range of motion and neck supple.     Right lower leg: No edema.     Left lower leg: No edema.  Lymphadenopathy:     Cervical: No cervical adenopathy.  Skin:    General: Skin is warm and dry.     Findings: No erythema or rash.  Neurological:     Mental Status: He is alert and oriented to person, place, and time.  Psychiatric:        Behavior: Behavior normal.     Comments: Well groomed, good eye contact, normal speech and thoughts      DXA bone density  Narrative  [facility]- DEXA Report    REFERRING MD: G.W.KERNODLE                        TECHNICIAN:  Clearence Ped  HISTORY: BASELINE STUDY. 79 YEAR OLD WHITE MALE.   SITE DATE BMD g/cm2  T-SCORE Z-SCORE g/cm2  CHANGE % CHANGE STATISTICALLY        SIGNIFICANT?  LUMBAR  SPINE L1- L4 01/02/17 0.787 -2.8          HIP L.FEM.NECK  01/02/17 0.709 -1.6           L.TOTAL HIP 01/02/17 0.778 -1.7          FOREARM  L/R 33%             INTERPRETATION:  Osteoporosis.    FRACTURE RISK ASSESSMENT (FRAX)  _3.4____ 10 year risk of hip fracture.   __9.6___ 10 year risk of any  major fracture.   TREATMENT:   The National Osteoporosis Foundation (2008) recommends treatment for:  Patients with hip or vertebral fracture (clinical or morphometric)  Patients with osteoporosis as defined by T score < = -2.5  Postmenopausal women or men age 16 and older with low bone mass (T score  -1.0 to -2.5, osteopenia) at the femoral neck, total hip, or spine and 10  year hip fracture risk probability >3% or a 10 year all major osteoporosis  related fracture probability of >20%.  BMD should be monitored two years after initiating therapy and at two-year  intervals thereafter.   _______________________________________  G. Fayrene Fearing., MD, Julious Payer, [place]       Certified Clinical Densitometrist   The [place] is a Hologic QDR 4500.  [facility] facility and technologist Least Significant Change [facility]):  Technologist Site LS Spine  Site Hip  L R  0.054 g/cm2  0.033 g/cm2  at 95% confidence level    Results for orders placed or performed in visit on 06/08/19  PSA  Result Value  Ref Range   Prostate Specific Ag, Serum 1.9 0.0 - 4.0 ng/mL      Assessment & Plan:   Problem List Items Addressed This Visit    PIN III (prostatic intraepithelial neoplasia III)    See A&P BPH PSA      Osteoporosis of lumbar spine    Chronic problem, likely secondary to chronic prednisone high risk med and age No known fractures due to OP Failed Fosamax due to myalgia and complications Last DEXA [facility] [facility], L spine -2.7 Awaiting upcoming Rheum apt and DEXA repeat, then considering Prolia      GCA (giant cell arteritis) ([place])    Chronic problem over past several years since  2016-17 Followed by Dr Jefm Bryant at [place] on prednisone, gradual lower dose Review records No longer having active symptoms, no headache or vision loss      Carotid atherosclerosis    Asymptomatic Previously diagnosed and followed by [facility] Dr Lucky Cowboy On ASA 81mg  Not on statin      BPH with elevated PSA - Primary    Followed by [facility]rology Controlled BPH on finasteride Elevated PSA in setting of abnormal prostate biopsy with suspicious cells / PIN high grade, no confirmation of prostate cancer. Continues on Finasteride 5mg  F/u PSA trend w/ Urology       Other Visit Diagnoses    Long-term use of high-risk medication       Relevant Medications   PRILOSEC 20 MG capsule       Meds ordered this encounter  Medications  . PRILOSEC 20 MG capsule    Sig: Take 1 capsule (20 mg total) by mouth daily before breakfast.    Dispense:  30 capsule    Refill:  0     Follow up plan: Return in about 26 weeks (around 08/10/2020) for 6 month - yearly medicare checkup (same day as wife, Jackson Coffey, she is 08/10/20 8am),  lab after.  Nobie Putnam, [facility] 02/10/2020, 10:43 AM

## 2020-02-10 NOTE — Patient Instructions (Addendum)
Thank you for coming to the office today.  Keep up the good work  Keep track of BP  Blood work after our next physical in February   Please schedule a Follow-up Appointment to: Return in about 26 weeks (around 08/10/2020) for 6 month - yearly medicare checkup (same day as wife, Damascus Feldpausch, she is 08/10/20 8am),  lab after.  If you have any other questions or concerns, please feel free to call the office or send a message through [place]. You may also schedule an earlier appointment if necessary.  Additionally, you may be receiving a survey about your experience at our office within a few days to 1 week by e-mail or mail. We value your feedback.  Nobie Putnam, DO [place]

## 2020-02-11 ENCOUNTER — Encounter: Payer: Self-pay | Admitting: Family Medicine

## 2020-02-11 DIAGNOSIS — M810 Age-related osteoporosis without current pathological fracture: Secondary | ICD-10-CM | POA: Insufficient documentation

## 2020-02-11 NOTE — Assessment & Plan Note (Signed)
Asymptomatic Previously diagnosed and followed by [facility] Dr Lucky Cowboy On ASA 81mg  Not on statin

## 2020-02-11 NOTE — Assessment & Plan Note (Addendum)
Chronic problem over past several years since 2016-17 Followed by [facility] Managed on prednisone, gradual lower dose Review records No longer having active symptoms, no headache or vision loss 

## 2020-02-11 NOTE — Assessment & Plan Note (Signed)
Followed by [facility] Urology Controlled BPH on finasteride Elevated PSA in setting of abnormal prostate biopsy with suspicious cells / PIN high grade, no confirmation of prostate cancer. Continues on Finasteride 5mg F/u PSA trend w/ Urology 

## 2020-02-11 NOTE — Assessment & Plan Note (Signed)
Chronic problem, likely secondary to chronic prednisone high risk med and age No known fractures due to OP Failed Fosamax due to myalgia and complications Last DEXA [address] [facility], L spine -2.7 Awaiting upcoming Rheum apt and DEXA repeat, then considering Prolia

## 2020-02-11 NOTE — Assessment & Plan Note (Signed)
See A&P BPH PSA

## 2020-02-17 DIAGNOSIS — M810 Age-related osteoporosis without current pathological fracture: Secondary | ICD-10-CM | POA: Diagnosis not present

## 2020-02-17 DIAGNOSIS — M8588 Other specified disorders of bone density and structure, other site: Secondary | ICD-10-CM | POA: Diagnosis not present

## 2020-02-17 DIAGNOSIS — M316 Other giant cell arteritis: Secondary | ICD-10-CM | POA: Diagnosis not present

## 2020-03-22 DIAGNOSIS — D1801 Hemangioma of skin and subcutaneous tissue: Secondary | ICD-10-CM | POA: Diagnosis not present

## 2020-03-23 DIAGNOSIS — M316 Other giant cell arteritis: Secondary | ICD-10-CM | POA: Diagnosis not present

## 2020-03-23 DIAGNOSIS — Z7952 Long term (current) use of systemic steroids: Secondary | ICD-10-CM | POA: Diagnosis not present

## 2020-05-27 ENCOUNTER — Other Ambulatory Visit: Payer: Self-pay | Admitting: Family Medicine

## 2020-05-27 DIAGNOSIS — N400 Enlarged prostate without lower urinary tract symptoms: Secondary | ICD-10-CM

## 2020-05-31 ENCOUNTER — Other Ambulatory Visit: Payer: Self-pay | Admitting: *Deleted

## 2020-05-31 MED ORDER — FINASTERIDE 5 MG PO TABS: 5.0000 mg | ORAL_TABLET | Freq: Every day | ORAL | 0 refills | Status: DC

## 2020-06-06 ENCOUNTER — Other Ambulatory Visit: Payer: PPO

## 2020-06-06 ENCOUNTER — Other Ambulatory Visit: Payer: Self-pay

## 2020-06-06 DIAGNOSIS — N400 Enlarged prostate without lower urinary tract symptoms: Secondary | ICD-10-CM | POA: Diagnosis not present

## 2020-06-07 LAB — PSA: Prostate Specific Ag, Serum: 1.7 ng/mL (ref 0.0–4.0)

## 2020-06-07 NOTE — Progress Notes (Signed)
06/08/2020 9:57 AM   Jackson Coffey December 14, 1940 [id number]   Virtual Visit via Telephone Note  I connected with Dreama Saa on 06/08/20 at  9:30 AM EST by telephone and verified that I am speaking with the correct person using two identifiers.  Location: Patient: Home Provider: Office   I discussed the limitations, risks, security and privacy concerns of performing an evaluation and management service by telephone and the availability of in person appointments. I also discussed with the patient that there may be a patient responsible charge related to this service. The patient expressed understanding and agreed to proceed.   History of Present Illness: He has a personal history of multiple GU issues including history of elevated PSA, personal diagnosis of high-grade PIN, history or prostatitis, BPH with urinary obstruction, history of kidney stones, dysfunction and Peyronie's disease.  He underwent prostate biopsy in 2009 at which time his prostate volume was noted to be 29 cc.  This showed evidence of high-grade PIN and a single focus as well as a single focus of abnormal "suspicious cells".  His most recent PSA on 06/06/2020 was 1.7 (3.4), which has been stably low.  He is chronically on finasteride presumably for chemoprevention.      His only urinary complaint is nocturia x1.  Patient denies any modifying or aggravating factors.  Patient denies any gross hematuria, dysuria or suprapubic/flank pain.  Patient denies any fevers, chills, nausea or vomiting.   He has a personal history of kidney stone.  The last time he passed a stone was several years ago.  Currently asymptotic.    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 Madelin Headings.    Personal history of ED previously on Viagra.  He uses a VED, but he is not very sexually active at this time.   I PSS: 1/2     Previous I PSS score:  5/2   IPSS    Row Name 06/08/20 0900           International Prostate Symptom Score   How often have you had the sensation of not emptying your bladder? Not at All     How often have you had to urinate less than every two hours? Not at All     How often have you found you stopped and started again several times when you urinated? Not at All     How often have you found it difficult to postpone urination? Not at All     How often have you had a weak urinary stream? Not at All     How often have you had to strain to start urination? Not at All     How many times did you typically get up at night to urinate? 1 Time     Total IPSS Score 1       Quality of Life due to urinary symptoms   If you were to spend the rest of your life with your urinary condition just the way it is now how would you feel about that? Mostly Satisfied            Score:  1-7 Mild 8-19 Moderate 20-35 Severe    Observations/Objective: He is answering questions appropriately and does not sound distressed  Assessment and Plan:  1. BPH with LU TS - continue the finasteride 5 mg - refills given - follow up in one year for Virtual Visit  2. History of  elevated PSA - PSA remains stable  Follow Up Instructions:   Follow up in one year for a virtual visit with PSA  I discussed the assessment and treatment plan with the patient. The patient was provided an opportunity to ask questions and all were answered. The patient agreed with the plan and demonstrated an understanding of the instructions.   The patient was advised to call back or seek an in-person evaluation if the symptoms worsen or if the condition fails to improve as anticipated.  I provided 10 minutes of non-face-to-face time during this encounter.   Ellianah Cordy, PA-C

## 2020-06-08 ENCOUNTER — Other Ambulatory Visit: Payer: Self-pay

## 2020-06-08 ENCOUNTER — Telehealth (INDEPENDENT_AMBULATORY_CARE_PROVIDER_SITE_OTHER): Payer: PPO | Admitting: Urology

## 2020-06-08 DIAGNOSIS — N400 Enlarged prostate without lower urinary tract symptoms: Secondary | ICD-10-CM

## 2020-06-08 MED ORDER — FINASTERIDE 5 MG PO TABS: 5.0000 mg | ORAL_TABLET | Freq: Every day | ORAL | 3 refills | Status: DC

## 2020-06-22 DIAGNOSIS — M316 Other giant cell arteritis: Secondary | ICD-10-CM | POA: Diagnosis not present

## 2020-07-20 DIAGNOSIS — M316 Other giant cell arteritis: Secondary | ICD-10-CM | POA: Diagnosis not present

## 2020-08-10 ENCOUNTER — Encounter: Payer: PPO | Admitting: Family Medicine

## 2020-08-17 ENCOUNTER — Ambulatory Visit (INDEPENDENT_AMBULATORY_CARE_PROVIDER_SITE_OTHER): Payer: PPO | Admitting: Family Medicine

## 2020-08-17 ENCOUNTER — Other Ambulatory Visit: Payer: Self-pay

## 2020-08-17 ENCOUNTER — Encounter: Payer: Self-pay | Admitting: Family Medicine

## 2020-08-17 VITALS — BP 132/70 | HR 63 | Ht 70.0 in | Wt 145.2 lb

## 2020-08-17 DIAGNOSIS — N400 Enlarged prostate without lower urinary tract symptoms: Secondary | ICD-10-CM | POA: Diagnosis not present

## 2020-08-17 DIAGNOSIS — M810 Age-related osteoporosis without current pathological fracture: Secondary | ICD-10-CM

## 2020-08-17 DIAGNOSIS — M62838 Other muscle spasm: Secondary | ICD-10-CM

## 2020-08-17 DIAGNOSIS — R7309 Other abnormal glucose: Secondary | ICD-10-CM

## 2020-08-17 DIAGNOSIS — R972 Elevated prostate specific antigen [PSA]: Secondary | ICD-10-CM | POA: Diagnosis not present

## 2020-08-17 DIAGNOSIS — M316 Other giant cell arteritis: Secondary | ICD-10-CM

## 2020-08-17 DIAGNOSIS — I6523 Occlusion and stenosis of bilateral carotid arteries: Secondary | ICD-10-CM

## 2020-08-17 DIAGNOSIS — Z0000 Encounter for general adult medical examination without abnormal findings: Secondary | ICD-10-CM | POA: Diagnosis not present

## 2020-08-17 DIAGNOSIS — H9113 Presbycusis, bilateral: Secondary | ICD-10-CM | POA: Diagnosis not present

## 2020-08-17 MED ORDER — CYCLOBENZAPRINE HCL 10 MG PO TABS: 10.0000 mg | ORAL_TABLET | Freq: Three times a day (TID) | ORAL | 2 refills | Status: DC | PRN

## 2020-08-17 NOTE — Patient Instructions (Addendum)
Thank you for coming to the office today.  Keep close watch on BP readings at home, goal is < 140/90. Check your BP at least once a week if you can, write down readings and notify us if elevated reading.  Send MyChart message with name of the muscle medication dosage, how many pills per bottle and instructions if you can. I can re order it.  Labs today stay tuned for results.  Please schedule a Follow-up Appointment to: Return in about 6 months (around 02/14/2021) for 6 month follow-up BP check, BPH, GERD.  If you have any other questions or concerns, please feel free to call the office or send a message through [place]. You may also schedule an earlier appointment if necessary.  Additionally, you may be receiving a survey about your experience at our office within a few days to 1 week by e-mail or mail. We value your feedback.  Nobie Putnam, DO [place]

## 2020-08-17 NOTE — Progress Notes (Signed)
Subjective:    Patient ID: Jackson Coffey, male    DOB: [birth year], 80 y.o.   MRN: [id number]  Jackson Coffey is a 80 y.o. male presenting on 08/17/2020 for Gastroesophageal Reflux   HPI   Specialist Rheumatology - Followed by Dr Jefm Bryant [facility])  Temporal Arteritis / Osteoporosis On chronic prednisone for past 5 years, initially high dose Prednisone 60mg  for weeks, had headache, had vision loss in past as well has regained, has adjusted dosing. He was previously alternating Prednisone 5mg  alternating with 2.5 mg dose. Now last visit with [facility] Rheumatology Dr Jefm Bryant - 06/2020 and 07/2020 they reduced dose to Prednisone 2.5mg  daily now.  Last bone density 2018, DEXA, failed Fosamax, with myalgia and complication, then has been off this now, and has upcoming repeat DEXA, will consider injection such as Prolia after.  GERD Omeprazole 20mg  daily OTC to prevent side effect from chronic prednisone.  BPH / High Grade PIN / Elevated PSA / History of Kidney Stones Last Urology follow-up 06/2020 telemedicine with [facility], for PSA and BPH follow-up. Last lab was PSA 1.7 (06/06/20) - History of prior kidney stones Past few years, has had elevated PSA. Had prostate biopsy multiple times, has had some High Grade PIN and pre-cancerous cells also suspicious abnormal cells. Had improved PSA On Finasteride doing well, chronically Admits nocturia x 1 Prior kidney surgery age 57, had a congenital Left UPJ artery crossing issue, prior Dr Madelin Headings  Elevated BP No known history of Hypertension. Has had occasional elevated reading. Often improves on repeat.  Carotid Atherosclerosis He is on ASA 81 Not on statin therapy Previously imaged, with history of Temporal arteritis  Former smoker Reports history of possible COPD. But he says this has not been confirmed and he has no problems with this. No prior imaging to support.  Health Maintenance: UTD COVID19 vaccine, updated booster  04/2020, and Flu Shot 06/2020  Depression screen [facility] 2/9 02/10/2020 04/30/2018 06/03/2017  Decreased Interest 0 0 0  Down, Depressed, Hopeless 0 0 0  PHQ - 2 Score 0 0 0  Altered sleeping - 0 -  Tired, decreased energy - 0 -  Change in appetite - 0 -  Feeling bad or failure about yourself  - 0 -  Trouble concentrating - 0 -  Moving slowly or fidgety/restless - 0 -  Suicidal thoughts - 0 -  PHQ-9 Score - 0 -  Difficult doing work/chores - Not difficult at all -    Past Medical History:  Diagnosis Date  . Arthritis   . Carotid atherosclerosis 12/28/2015  . Do not resuscitate status 01/16/2017   Discussed with patient, has living will  . Elevated PSA   . Giant cell arteritis ([place]) 12/28/2015  . Kidney stones   . PIN III (prostatic intraepithelial neoplasia III) 12/28/2015  . Skin cancer 05/31/2017   side of nose   Past Surgical History:  Procedure Laterality Date  . ARTERY BIOPSY Left 02/18/2015   Procedure: BIOPSY TEMPORAL ARTERY;  Surgeon: Algernon Huxley, MD;  Location: [facility] ORS;  Service: Vascular;  Laterality: Left;  . CYSTOSCOPY     Dr. Jacqlyn Larsen, urologist  . KIDNEY SURGERY     congential defect  . MOHS SURGERY Left    left nasolabial fold  . PROSTATE BIOPSY  2011, 2009   Dr. Jacqlyn Larsen, urologist   Social History   Socioeconomic History  . Marital status: Married    Spouse name: Not on file  . Number of children: Not on  file  . Years of education: Not on file  . Highest education level: Not on file  Occupational History  . Not on file  Tobacco Use  . Smoking status: Former Smoker    Packs/day: 2.00    Quit date: 02/16/1977    Years since quitting: 43.5  . Smokeless tobacco: Never Used  Vaping Use  . Vaping Use: Never used  Substance and Sexual Activity  . Alcohol use: Not Currently  . Drug use: Never  . Sexual activity: Not Currently  Other Topics Concern  . Not on file  Social History Narrative  . Not on file   Social Determinants of Health   Financial Resource  Strain: Not on file  Food Insecurity: Not on file  Transportation Needs: Not on file  Physical Activity: Not on file  Stress: Not on file  Social Connections: Not on file  Intimate Partner Violence: Not on file   Family History  Problem Relation Age of Onset  . Parkinson's disease Mother 61  . Heart attack Father 88  . Heart disease Father   . Thyroid disease Daughter   . Stroke Maternal Grandmother   . Heart disease Maternal Grandmother   . Cancer Paternal Grandfather        unknown  . Prostate cancer Paternal Grandfather    Current Outpatient Medications on File Prior to Visit  Medication Sig  . alendronate (FOSAMAX) 70 MG tablet Take 70 mg by mouth once a week.  . Ascorbic Acid (VITAMIN C) 100 MG tablet Take 100 mg by mouth daily.  Marland Kitchen aspirin 81 MG tablet Take 81 mg by mouth daily.  . calcium carbonate (TUMS - DOSED IN MG ELEMENTAL CALCIUM) 500 MG chewable tablet Chew 1 tablet by mouth daily.  . finasteride (PROSCAR) 5 MG tablet Take 1 tablet (5 mg total) by mouth daily.  . Multiple Vitamin (MULTIVITAMIN) tablet Take 1 tablet by mouth daily.  . predniSONE (DELTASONE) 5 MG tablet One pill by mouth every other day, alternating with half of a pill the other days  . PRILOSEC 20 MG capsule Take 1 capsule (20 mg total) by mouth daily before breakfast.   No current facility-administered medications on file prior to visit.    Review of Systems  Constitutional: Negative for activity change, appetite change, chills, diaphoresis, fatigue and fever.  HENT: Positive for hearing loss. Negative for congestion.   Eyes: Negative for visual disturbance.  Respiratory: Negative for apnea, cough, chest tightness, shortness of breath and wheezing.   Cardiovascular: Negative for chest pain, palpitations and leg swelling.  Gastrointestinal: Negative for abdominal pain, anal bleeding, blood in stool, constipation, diarrhea, nausea and vomiting.  Endocrine: Negative for cold intolerance.   Genitourinary: Negative for difficulty urinating, dysuria, frequency and hematuria.  Musculoskeletal: Negative for arthralgias, back pain and neck pain.  Skin: Negative for rash.  Allergic/Immunologic: Negative for environmental allergies.  Neurological: Negative for dizziness, weakness, light-headedness, numbness and headaches.  Hematological: Negative for adenopathy.  Psychiatric/Behavioral: Negative for behavioral problems, dysphoric mood and sleep disturbance. The patient is not nervous/anxious.    Per HPI unless specifically indicated above      Objective:    BP 132/70 (BP Location: Left Arm, Cuff Size: Normal)   Pulse 63   Ht 5\' 10"  (1.778 m)   Wt 145 lb 3.2 oz (65.9 kg)   SpO2 99%   BMI 20.83 kg/m   Wt Readings from Last 3 Encounters:  08/17/20 145 lb 3.2 oz (65.9 kg)  02/10/20 145 lb  9.6 oz (66 kg)  06/10/19 148 lb 12.8 oz (67.5 kg)    Physical Exam Vitals and nursing note reviewed.  Constitutional:      General: He is not in acute distress.    Appearance: He is well-developed and well-nourished. He is not diaphoretic.     Comments: Well-appearing, comfortable, cooperative  HENT:     Head: Normocephalic and atraumatic.     Mouth/Throat:     Mouth: Oropharynx is clear and moist.  Eyes:     General:        Right eye: No discharge.        Left eye: No discharge.     Extraocular Movements: EOM normal.     Conjunctiva/sclera: Conjunctivae normal.     Pupils: Pupils are equal, round, and reactive to light.  Neck:     Thyroid: No thyromegaly.  Cardiovascular:     Rate and Rhythm: Normal rate and regular rhythm.     Pulses: Intact distal pulses.     Heart sounds: Normal heart sounds. No murmur heard.   Pulmonary:     Effort: Pulmonary effort is normal. No respiratory distress.     Breath sounds: Normal breath sounds. No wheezing or rales.  Abdominal:     General: Bowel sounds are normal. There is no distension.     Palpations: Abdomen is soft. There is no  mass.     Tenderness: There is no abdominal tenderness.  Musculoskeletal:        General: No tenderness or edema. Normal range of motion.     Cervical back: Normal range of motion and neck supple.     Right lower leg: No edema.     Left lower leg: No edema.     Comments: Upper / Lower Extremities: - Normal muscle tone, strength bilateral upper extremities 5/5, lower extremities 5/5  Lymphadenopathy:     Cervical: No cervical adenopathy.  Skin:    General: Skin is warm and dry.     Findings: No erythema or rash.  Neurological:     Mental Status: He is alert and oriented to person, place, and time.     Comments: Distal sensation intact to light touch all extremities  Psychiatric:        Mood and Affect: Mood and affect normal.        Behavior: Behavior normal.     Comments: Well groomed, good eye contact, normal speech and thoughts    Results for orders placed or performed in visit on 06/06/20  PSA  Result Value Ref Range   Prostate Specific Ag, Serum 1.7 0.0 - 4.0 ng/mL      Assessment & Plan:   Problem List Items Addressed This Visit    Presbycusis   Osteoporosis of lumbar spine   Relevant Medications   alendronate (FOSAMAX) 70 MG tablet   GCA (giant cell arteritis) ([place])   Relevant Orders   CBC with Differential/Platelet   COMPLETE METABOLIC PANEL WITH GFR   Carotid atherosclerosis   Relevant Orders   Lipid panel   BPH with elevated PSA    Other Visit Diagnoses    Annual physical exam    -  Primary   Relevant Orders   CBC with Differential/Platelet   COMPLETE METABOLIC PANEL WITH GFR   Lipid panel   Abnormal glucose       Relevant Orders   Hemoglobin A1c      Updated Health Maintenance information - UTD with COVID19 vaccine and Flu Shot. Labs  today, follow-up results Continue f/u with Rheumatology on chronic lower dose prednisone for GCA Followed by Urology for BPH  Encouraged improvement to lifestyle with diet and exercise Maintain healthy  weight  send [place] muscle relaxant for muscle cramps. flexeril   No orders of the defined types were placed in this encounter.     Follow up plan: Return in about 6 months (around 02/14/2021) for 6 month follow-up BP check, BPH, GERD.  Nobie Putnam, [facility] 08/17/2020, 8:55 AM

## 2020-08-18 LAB — COMPLETE METABOLIC PANEL WITH GFR
AG Ratio: 2.2 (calc) (ref 1.0–2.5)
ALT: 14 U/L (ref 9–46)
AST: 18 U/L (ref 10–35)
Albumin: 4.4 g/dL (ref 3.6–5.1)
Alkaline phosphatase (APISO): 70 U/L (ref 35–144)
BUN: 16 mg/dL (ref 7–25)
CO2: 32 mmol/L (ref 20–32)
Calcium: 9.5 mg/dL (ref 8.6–10.3)
Chloride: 102 mmol/L (ref 98–110)
Creat: 0.88 mg/dL (ref 0.70–1.18)
GFR, Est African American: 95 mL/min/{1.73_m2} (ref >=60)
GFR, Est Non African American: 82 mL/min/{1.73_m2} (ref >=60)
Globulin: 2 g/dL (calc) (ref 1.9–3.7)
Glucose, Bld: 80 mg/dL (ref 65–99)
Potassium: 4.2 mmol/L (ref 3.5–5.3)
Sodium: 141 mmol/L (ref 135–146)
Total Bilirubin: 0.7 mg/dL (ref 0.2–1.2)
Total Protein: 6.4 g/dL (ref 6.1–8.1)

## 2020-08-18 LAB — CBC WITH DIFFERENTIAL/PLATELET
Absolute Monocytes: 459 cells/uL (ref 200–950)
Basophils Absolute: 19 cells/uL (ref 0–200)
Basophils Relative: 0.3 %
Eosinophils Absolute: 291 cells/uL (ref 15–500)
Eosinophils Relative: 4.7 %
HCT: 43.2 % (ref 38.5–50.0)
Hemoglobin: 14.7 g/dL (ref 13.2–17.1)
Lymphs Abs: 1370 cells/uL (ref 850–3900)
MCH: 33.3 pg — ABNORMAL HIGH (ref 27.0–33.0)
MCHC: 34 g/dL (ref 32.0–36.0)
MCV: 97.7 fL (ref 80.0–100.0)
MPV: 10.8 fL (ref 7.5–12.5)
Monocytes Relative: 7.4 %
Neutro Abs: 4061 cells/uL (ref 1500–7800)
Neutrophils Relative %: 65.5 %
Platelets: 265 10*3/uL (ref 140–400)
RBC: 4.42 10*6/uL (ref 4.20–5.80)
RDW: 11.7 % (ref 11.0–15.0)
Total Lymphocyte: 22.1 %
WBC: 6.2 10*3/uL (ref 3.8–10.8)

## 2020-08-18 LAB — HEMOGLOBIN A1C
Hgb A1c MFr Bld: 5.1 % of total Hgb (ref <5.7)
Mean Plasma Glucose: 100 mg/dL
eAG (mmol/L): 5.5 mmol/L

## 2020-08-18 LAB — LIPID PANEL
Cholesterol: 197 mg/dL (ref <200)
HDL: 49 mg/dL (ref >=40)
LDL Cholesterol (Calc): 118 mg/dL (calc) — ABNORMAL HIGH
Non-HDL Cholesterol (Calc): 148 mg/dL (calc) — ABNORMAL HIGH (ref <130)
Total CHOL/HDL Ratio: 4 (calc) (ref <5.0)
Triglycerides: 178 mg/dL — ABNORMAL HIGH (ref <150)

## 2020-08-24 ENCOUNTER — Encounter: Payer: PPO | Admitting: Family Medicine

## 2020-10-26 DIAGNOSIS — M316 Other giant cell arteritis: Secondary | ICD-10-CM | POA: Diagnosis not present

## 2020-10-26 DIAGNOSIS — Z7952 Long term (current) use of systemic steroids: Secondary | ICD-10-CM | POA: Diagnosis not present

## 2020-11-11 ENCOUNTER — Telehealth: Payer: Self-pay | Admitting: Family Medicine

## 2020-11-11 NOTE — Telephone Encounter (Signed)
Copied from [place] [id number]. Topic: Medicare AWV >> Nov 11, 2020  2:04 PM Cher Nakai R wrote: Reason for CRM:  Left message for patient to call back and schedule the Medicare Annual Wellness Visit (AWV) virtually or by telephone.  Last AWV [phone]  Please schedule at anytime with [facility].  40 minute appointment  Any questions, please call me at [phone]

## 2020-12-12 DIAGNOSIS — H25813 Combined forms of age-related cataract, bilateral: Secondary | ICD-10-CM | POA: Diagnosis not present

## 2021-01-25 DIAGNOSIS — M316 Other giant cell arteritis: Secondary | ICD-10-CM | POA: Diagnosis not present

## 2021-01-25 DIAGNOSIS — Z7952 Long term (current) use of systemic steroids: Secondary | ICD-10-CM | POA: Diagnosis not present

## 2021-03-27 ENCOUNTER — Telehealth: Payer: Self-pay | Admitting: Family Medicine

## 2021-03-27 NOTE — Telephone Encounter (Signed)
Left message for patient to call back and schedule the Medicare Annual Wellness Visit (AWV) virtually or by telephone.  Last AWV 01/16/17  Please schedule at anytime with [place].  40 minute appointment  Any questions, please call me at [phone]

## 2021-04-26 DIAGNOSIS — M316 Other giant cell arteritis: Secondary | ICD-10-CM | POA: Diagnosis not present

## 2021-06-08 ENCOUNTER — Other Ambulatory Visit: Payer: PPO

## 2021-06-08 ENCOUNTER — Other Ambulatory Visit: Payer: Self-pay

## 2021-06-08 DIAGNOSIS — Z87898 Personal history of other specified conditions: Secondary | ICD-10-CM

## 2021-06-09 LAB — PSA: Prostate Specific Ag, Serum: 2.3 ng/mL (ref 0.0–4.0)

## 2021-06-13 NOTE — Progress Notes (Signed)
Virtual Visit via Telephone Note  I connected with Rithwik Schmieg on 06/14/21 at  8:30 AM EST by telephone and verified that I am speaking with the correct person using two identifiers.  Location: Patient: Home Provider: Office   I discussed the limitations, risks, security and privacy concerns of performing an evaluation and management service by telephone and the availability of in person appointments. I also discussed with the patient that there may be a patient responsible charge related to this service. The patient expressed understanding and agreed to proceed.   History of Present Illness: 1. Elevated PSA -PSA trend Component     Latest Ref Rng & Units 06/08/2019 06/06/2020 06/08/2021  Prostate Specific Ag, Serum     0.0 - 4.0 ng/mL 1.9 1.7 2.3  -underwent prostate biopsy in 2009 at which time his prostate volume was noted to be 29 cc.  This showed evidence of high-grade PIN and a single focus as well as a single focus of abnormal "suspicious cells" -managed with finasteride 5 mg daily  2. Nephrolithiasis -per patient passed a stone several years ago -CT 2013 Nonobstructing calculi are appreciated within the lower pole of the left kidney measuring 4.2 and 3.3 mm  3. UPJ obstruction -left UPJ obstruction for crossing vessel 40+ years ago by Dr. Madelin Headings  4. ED -contributing factors of age, Peyronie's disease, BPH and former smoker -managed with sildenafil and VED   Observations/Objective: He is answering questions appropriately and does not sound distressed.  Assessment and Plan: 1. Elevated PSA -Patient has had 2 biopsies with the most recent in 2009 which showed evidence of a single focus of abnormal suspicious cells -He is currently being managed with finasteride which I refilled today -He had a slight uptake in his PSA from 1.7 last year to 2.3 this year while on finasteride   Follow Up Instructions:  PSA only in 6 months to monitor trend   I discussed the  assessment and treatment plan with the patient. The patient was provided an opportunity to ask questions and all were answered. The patient agreed with the plan and demonstrated an understanding of the instructions.   The patient was advised to call back or seek an in-person evaluation if the symptoms worsen or if the condition fails to improve as anticipated.  I provided 15 minutes of non-face-to-face time during this encounter.   Jaydalynn Olivero, PA-C

## 2021-06-14 ENCOUNTER — Ambulatory Visit (INDEPENDENT_AMBULATORY_CARE_PROVIDER_SITE_OTHER): Payer: PPO | Admitting: Urology

## 2021-06-14 ENCOUNTER — Other Ambulatory Visit: Payer: Self-pay

## 2021-06-14 DIAGNOSIS — N400 Enlarged prostate without lower urinary tract symptoms: Secondary | ICD-10-CM | POA: Diagnosis not present

## 2021-06-14 MED ORDER — FINASTERIDE 5 MG PO TABS: 5.0000 mg | ORAL_TABLET | Freq: Every day | ORAL | 3 refills | Status: DC

## 2021-08-16 ENCOUNTER — Ambulatory Visit (INDEPENDENT_AMBULATORY_CARE_PROVIDER_SITE_OTHER): Payer: PPO | Admitting: Family Medicine

## 2021-08-16 ENCOUNTER — Other Ambulatory Visit: Payer: Self-pay

## 2021-08-16 ENCOUNTER — Encounter: Payer: Self-pay | Admitting: Family Medicine

## 2021-08-16 VITALS — BP 128/68 | HR 61 | Ht 70.0 in | Wt 143.6 lb

## 2021-08-16 DIAGNOSIS — M316 Other giant cell arteritis: Secondary | ICD-10-CM | POA: Diagnosis not present

## 2021-08-16 DIAGNOSIS — I6523 Occlusion and stenosis of bilateral carotid arteries: Secondary | ICD-10-CM | POA: Diagnosis not present

## 2021-08-16 DIAGNOSIS — M810 Age-related osteoporosis without current pathological fracture: Secondary | ICD-10-CM

## 2021-08-16 DIAGNOSIS — H9193 Unspecified hearing loss, bilateral: Secondary | ICD-10-CM

## 2021-08-16 DIAGNOSIS — R972 Elevated prostate specific antigen [PSA]: Secondary | ICD-10-CM

## 2021-08-16 DIAGNOSIS — N400 Enlarged prostate without lower urinary tract symptoms: Secondary | ICD-10-CM | POA: Diagnosis not present

## 2021-08-16 DIAGNOSIS — R7309 Other abnormal glucose: Secondary | ICD-10-CM

## 2021-08-16 NOTE — Progress Notes (Signed)
Subjective:    Patient ID: Jackson Coffey, male    DOB: June 28, 1941, 81 y.o.   MRN: [id number]  Jackson Coffey is a 81 y.o. male presenting on 08/16/2021 for Osteoporosis   HPI  Specialist Rheumatology - Followed by Dr Jefm Bryant [facility])   Temporal Arteritis / Osteoporosis On chronic prednisone for past 5-6 years, initially high dose Prednisone 60mg  for weeks, had headache, had vision loss in past as well has regained, has adjusted dosing. He was previously alternating Prednisone 5mg  alternating with 2.5 mg dose. Now continues with Rheum follow up and maintained on half dose of 5mg , for 2.5mg  daily dose, unable to come off med   Updated DEXA has improved.  Cramps Previously on Flexeril PRN for muscle aches cramps in legs at night PRN, worse in past with inc activity.Not taking regularly now   GERD Omeprazole 20mg  daily OTC to prevent side effect from chronic prednisone.   BPH / High Grade PIN / Elevated PSA / History of Kidney Stones Last Urology follow-up 06/2020 telemedicine with [facility], for PSA and BPH follow-up PSA surveillance last 2.3 (06/2021) - History of prior kidney stones Past few years, has had elevated PSA. Had prostate biopsy multiple times, has had some High Grade PIN and pre-cancerous cells also suspicious abnormal cells. Had improved PSA On Finasteride doing well, chronically Admits nocturia x 1 Prior kidney surgery age 47, had a congenital Left UPJ artery crossing issue, prior Dr Madelin Headings   Elevated BP No known history of Hypertension. Has had occasional elevated reading. Often improves on repeat. home readings well controlled, match BP reading here   Carotid Atherosclerosis He is on ASA 81 Not on statin therapy Previously imaged, with history of Temporal arteritis   Former smoker Reports history of possible COPD. But he says this has not been confirmed and he has no problems with this. No prior imaging to support.   Health  Maintenance: UTD COVID19 vaccine, updated booster 04/2020, and Flu Shot 06/2020  Depression screen [facility] 2/9 08/16/2021 02/10/2020 04/30/2018  Decreased Interest 0 0 0  Down, Depressed, Hopeless 0 0 0  PHQ - 2 Score 0 0 0  Altered sleeping - - 0  Tired, decreased energy - - 0  Change in appetite - - 0  Feeling bad or failure about yourself  - - 0  Trouble concentrating - - 0  Moving slowly or fidgety/restless - - 0  Suicidal thoughts - - 0  PHQ-9 Score - - 0  Difficult doing work/chores - - Not difficult at all    Social History   Tobacco Use   Smoking status: Former    Packs/day: 2.00    Types: Cigarettes    Quit date: 02/16/1977    Years since quitting: 44.5   Smokeless tobacco: Never  Vaping Use   Vaping Use: Never used  Substance Use Topics   Alcohol use: Not Currently   Drug use: Never    Review of Systems  Constitutional:  Negative for activity change, appetite change, chills, diaphoresis, fatigue and fever.  HENT:  Negative for congestion and hearing loss.   Eyes:  Negative for visual disturbance.  Respiratory:  Negative for cough, chest tightness, shortness of breath and wheezing.   Cardiovascular:  Negative for chest pain, palpitations and leg swelling.  Gastrointestinal:  Negative for abdominal pain, constipation, diarrhea, nausea and vomiting.  Genitourinary:  Negative for dysuria, frequency and hematuria.  Musculoskeletal:  Negative for arthralgias and neck pain.  Skin:  Negative for  rash.  Neurological:  Negative for dizziness, weakness, light-headedness, numbness and headaches.  Hematological:  Negative for adenopathy.  Psychiatric/Behavioral:  Negative for behavioral problems, dysphoric mood and sleep disturbance.   Per HPI unless specifically indicated above     Objective:    BP 128/68    Pulse 61    Ht 5\' 10"  (1.778 m)    Wt 143 lb 9.6 oz (65.1 kg)    SpO2 100%    BMI 20.60 kg/m   Wt Readings from Last 3 Encounters:  08/16/21 143 lb 9.6 oz (65.1 kg)   08/17/20 145 lb 3.2 oz (65.9 kg)  02/10/20 145 lb 9.6 oz (66 kg)    Physical Exam Vitals and nursing note reviewed.  Constitutional:      General: He is not in acute distress.    Appearance: He is well-developed. He is not diaphoretic.     Comments: Well-appearing thin appearing but stable weight, elderly 81 yr male, comfortable, cooperative  HENT:     Head: Normocephalic and atraumatic.  Eyes:     General:        Right eye: No discharge.        Left eye: No discharge.     Conjunctiva/sclera: Conjunctivae normal.     Pupils: Pupils are equal, round, and reactive to light.  Neck:     Thyroid: No thyromegaly.     Vascular: No carotid bruit.  Cardiovascular:     Rate and Rhythm: Normal rate and regular rhythm.     Pulses: Normal pulses.     Heart sounds: Normal heart sounds. No murmur heard. Pulmonary:     Effort: Pulmonary effort is normal. No respiratory distress.     Breath sounds: Normal breath sounds. No wheezing or rales.  Abdominal:     General: Bowel sounds are normal. There is no distension.     Palpations: Abdomen is soft. There is no mass.     Tenderness: There is no abdominal tenderness.  Musculoskeletal:        General: No tenderness. Normal range of motion.     Cervical back: Normal range of motion and neck supple.     Right lower leg: No edema.     Left lower leg: No edema.     Comments: Upper / Lower Extremities: - Normal muscle tone, strength bilateral upper extremities 5/5, lower extremities 5/5  Lymphadenopathy:     Cervical: No cervical adenopathy.  Skin:    General: Skin is warm and dry.     Findings: No erythema or rash.  Neurological:     Mental Status: He is alert and oriented to person, place, and time.     Comments: Distal sensation intact to light touch all extremities  Psychiatric:        Mood and Affect: Mood normal.        Behavior: Behavior normal.        Thought Content: Thought content normal.     Comments: Well groomed, good eye  contact, normal speech and thoughts     Results for orders placed or performed in visit on 06/08/21  PSA  Result Value Ref Range   Prostate Specific Ag, Serum 2.3 0.0 - 4.0 ng/mL      Assessment & Plan:   Problem List Items Addressed This Visit     Osteoporosis of lumbar spine   GCA (giant cell arteritis) ([place]) - Primary   Relevant Orders   COMPLETE METABOLIC PANEL WITH GFR   CBC with Differential/Platelet  Carotid atherosclerosis   Relevant Orders   COMPLETE METABOLIC PANEL WITH GFR   Lipid panel   BPH with elevated PSA   Bilateral hearing loss   Other Visit Diagnoses     Abnormal glucose       Relevant Orders   Hemoglobin A1c       Updated Health Maintenance information Fasting lab only today DEXA updated Encouraged improvement to lifestyle with diet and exercise Goal maintain weight - discussed nutrition, supplement. Has had prior wt loss plateau now. Continue f/u with Rheumatology on chronic lower dose prednisone for GCA Followed by Urology for BPH    Orders Placed This Encounter  Procedures   COMPLETE METABOLIC PANEL WITH GFR   Lipid panel    Order Specific Question:   Has the patient fasted?    Answer:   Yes   CBC with Differential/Platelet   Hemoglobin A1c     No orders of the defined types were placed in this encounter.     Follow up plan: Return in about 1 year (around 08/16/2022) for 1 year Follow-up Rheum, GERD, BPH labs AFTER.   Nobie Putnam, DO [facility] 08/16/2021, 8:32 AM

## 2021-08-16 NOTE — Patient Instructions (Addendum)
Thank you for coming to the office today.  BP looks great.  Future fasting labs after next visit.  Please schedule a Follow-up Appointment to: Return in about 1 year (around 08/16/2022) for 1 year Follow-up Rheum, GERD, BPH labs AFTER.  If you have any other questions or concerns, please feel free to call the office or send a message through [place]. You may also schedule an earlier appointment if necessary.  Additionally, you may be receiving a survey about your experience at our office within a few days to 1 week by e-mail or mail. We value your feedback.  Nobie Putnam, DO [place]

## 2021-08-17 LAB — COMPLETE METABOLIC PANEL WITH GFR
AG Ratio: 2.2 (calc) (ref 1.0–2.5)
ALT: 12 U/L (ref 9–46)
AST: 19 U/L (ref 10–35)
Albumin: 4.2 g/dL (ref 3.6–5.1)
Alkaline phosphatase (APISO): 73 U/L (ref 35–144)
BUN: 16 mg/dL (ref 7–25)
CO2: 30 mmol/L (ref 20–32)
Calcium: 9.5 mg/dL (ref 8.6–10.3)
Chloride: 105 mmol/L (ref 98–110)
Creat: 0.89 mg/dL (ref 0.70–1.22)
Globulin: 1.9 g/dL (calc) (ref 1.9–3.7)
Glucose, Bld: 80 mg/dL (ref 65–99)
Potassium: 4.2 mmol/L (ref 3.5–5.3)
Sodium: 142 mmol/L (ref 135–146)
Total Bilirubin: 0.9 mg/dL (ref 0.2–1.2)
Total Protein: 6.1 g/dL (ref 6.1–8.1)
eGFR: 87 mL/min/{1.73_m2} (ref >=60)

## 2021-08-17 LAB — CBC WITH DIFFERENTIAL/PLATELET
Absolute Monocytes: 488 cells/uL (ref 200–950)
Basophils Absolute: 12 cells/uL (ref 0–200)
Basophils Relative: 0.2 %
Eosinophils Absolute: 207 cells/uL (ref 15–500)
Eosinophils Relative: 3.4 %
HCT: 41 % (ref 38.5–50.0)
Hemoglobin: 13.5 g/dL (ref 13.2–17.1)
Lymphs Abs: 1202 cells/uL (ref 850–3900)
MCH: 32.7 pg (ref 27.0–33.0)
MCHC: 32.9 g/dL (ref 32.0–36.0)
MCV: 99.3 fL (ref 80.0–100.0)
MPV: 10.6 fL (ref 7.5–12.5)
Monocytes Relative: 8 %
Neutro Abs: 4191 cells/uL (ref 1500–7800)
Neutrophils Relative %: 68.7 %
Platelets: 248 10*3/uL (ref 140–400)
RBC: 4.13 10*6/uL — ABNORMAL LOW (ref 4.20–5.80)
RDW: 11.2 % (ref 11.0–15.0)
Total Lymphocyte: 19.7 %
WBC: 6.1 10*3/uL (ref 3.8–10.8)

## 2021-08-17 LAB — LIPID PANEL
Cholesterol: 203 mg/dL — ABNORMAL HIGH (ref <200)
HDL: 49 mg/dL (ref >=40)
LDL Cholesterol (Calc): 126 mg/dL (calc) — ABNORMAL HIGH
Non-HDL Cholesterol (Calc): 154 mg/dL (calc) — ABNORMAL HIGH (ref <130)
Total CHOL/HDL Ratio: 4.1 (calc) (ref <5.0)
Triglycerides: 163 mg/dL — ABNORMAL HIGH (ref <150)

## 2021-08-17 LAB — HEMOGLOBIN A1C
Hgb A1c MFr Bld: 5.1 % of total Hgb (ref <5.7)
Mean Plasma Glucose: 100 mg/dL
eAG (mmol/L): 5.5 mmol/L

## 2021-09-01 ENCOUNTER — Telehealth: Payer: Self-pay | Admitting: Family Medicine

## 2021-09-01 NOTE — Telephone Encounter (Signed)
Left message for patient to call back and schedule the Medicare Annual Wellness Visit (AWV) virtually or by telephone.  Last AWV 01/16/17  Please schedule at anytime with [facility].  40 minute appointment  Any questions, please call me at [phone]

## 2021-09-01 NOTE — Telephone Encounter (Signed)
Pts wife called in stating they do not do AWV appts, and did not want to schedule an appt, please advise.

## 2021-11-29 ENCOUNTER — Telehealth: Payer: Self-pay

## 2021-11-29 NOTE — Telephone Encounter (Signed)
I called and spoke with the patient wife Jackson Coffey and she declined the medicare wellness visit. She said they do not do the medicare wellness visits.

## 2021-12-12 NOTE — Progress Notes (Signed)
12/13/2021 9:12 AM   Jackson Coffey 07/05/1941 [id number]  Referring provider: Smitty Cords, DO [address] [place],  [place] [address]  Urological history: 1. Peyronie's disease -VED  2.  Nephrolithiasis -KUB, 2015 -2 small calcification over the lower pole left kidney  3.  Elevated PSA -PSA Trend  Prostate Specific Ag, Serum  Latest Ref Rng 0.0 - 4.0 ng/mL  06/08/2019 1.9   06/06/2020 1.7   06/08/2021 2.3   -prostate biopsy 2009 -high grade PIN and a single focus of abnormal " suspicious cells"  4.  BPH with LU TS -Finasteride 5 mg daily -I PSS 3/1  5.  Erectile dysfunction -VED  6.  Incomplete bladder emptying -Finasteride 5 mg daily  Chief Complaint  Patient presents with   Benign Prostatic Hypertrophy   HPI: Jackson Coffey is a 81 y.o. male who presents today for yearly follow up.   He has indicated that he is having back/flank pain on his ROS sheet, but he indicates that it is due to his arthritis and not renal colic.    He does not have any urinary complaints at this visit.  He has baseline nocturia x 1.  Patient denies any modifying or aggravating factors.  Patient denies any gross hematuria, dysuria or suprapubic/flank pain.  Patient denies any fevers, chills, nausea or vomiting.     IPSS     Row Name 12/13/21 0800         International Prostate Symptom Score   How often have you had the sensation of not emptying your bladder? Not at All     How often have you had to urinate less than every two hours? Not at All     How often have you found you stopped and started again several times when you urinated? Less than 1 in 5 times     How often have you found it difficult to postpone urination? Not at All     How often have you had a weak urinary stream? Less than 1 in 5 times     How often have you had to strain to start urination? Not at All     How many times did you typically get up at night to urinate? 1 Time     Total IPSS  Score 3       Quality of Life due to urinary symptoms   If you were to spend the rest of your life with your urinary condition just the way it is now how would you feel about that? Pleased              Score:  1-7 Mild 8-19 Moderate 20-35 Severe     PMH: Past Medical History:  Diagnosis Date   Arthritis    Carotid atherosclerosis 12/28/2015   Do not resuscitate status 01/16/2017   Discussed with patient, has living will   Elevated PSA    Giant cell arteritis (HCC) 12/28/2015   Kidney stones    PIN III (prostatic intraepithelial neoplasia III) 12/28/2015   Skin cancer 05/31/2017   side of nose    Surgical History: Past Surgical History:  Procedure Laterality Date   ARTERY BIOPSY Left 02/18/2015   Procedure: BIOPSY TEMPORAL ARTERY;  Surgeon: Annice Needy, MD;  Location: [facility] ORS;  Service: Vascular;  Laterality: Left;   CYSTOSCOPY     Dr. Achilles Dunk, urologist   KIDNEY SURGERY     congential defect   MOHS SURGERY Left  left nasolabial fold   PROSTATE BIOPSY  2011, 2009   Dr. Achilles Dunk, urologist    Home Medications:  Allergies as of 12/13/2021       Reactions   Geocillin [carbenicillin] Swelling   Septra [sulfamethoxazole-trimethoprim] Swelling   Tetracyclines & Related Swelling        Medication List        Accurate as of December 13, 2021  9:12 AM. If you have any questions, ask your nurse or doctor.          alendronate 70 MG tablet Commonly known as: FOSAMAX Take 70 mg by mouth once a week.   aspirin 81 MG tablet Take 81 mg by mouth daily.   calcium carbonate 500 MG chewable tablet Commonly known as: TUMS - dosed in mg elemental calcium Chew 1 tablet by mouth daily.   cyclobenzaprine 10 MG tablet Commonly known as: FLEXERIL Take 1 tablet (10 mg total) by mouth 3 (three) times daily as needed for muscle spasms.   finasteride 5 MG tablet Commonly known as: PROSCAR Take 1 tablet (5 mg total) by mouth daily.   multivitamin tablet Take 1 tablet by mouth  daily.   predniSONE 5 MG tablet Commonly known as: DELTASONE Take 2.5 mg by mouth daily. One pill by mouth every other day, alternating with half of a pill the other days   PriLOSEC 20 MG capsule Generic drug: omeprazole Take 1 capsule (20 mg total) by mouth daily before breakfast.   vitamin C 100 MG tablet Take 100 mg by mouth daily.        Allergies:  Allergies  Allergen Reactions   Geocillin [Carbenicillin] Swelling   Septra [Sulfamethoxazole-Trimethoprim] Swelling   Tetracyclines & Related Swelling    Family History: Family History  Problem Relation Age of Onset   Parkinson's disease Mother 1   Heart attack Father 67   Heart disease Father    Thyroid disease Daughter    Stroke Maternal Grandmother    Heart disease Maternal Grandmother    Cancer Paternal Grandfather        unknown   Prostate cancer Paternal Grandfather     Social History:  reports that he quit smoking about 44 years ago. He smoked an average of 2 packs per day. He has never used smokeless tobacco. He reports that he does not currently use alcohol. He reports that he does not use drugs.  ROS: Pertinent ROS in HPI  Physical Exam: BP (!) 164/79   Pulse 63   Ht 5\' 10"  (1.778 m)   Wt 141 lb (64 kg)   BMI 20.23 kg/m   Constitutional:  Well nourished. Alert and oriented, No acute distress. HEENT: [place] AT, moist mucus membranes.  Trachea midline Cardiovascular: No clubbing, cyanosis, or edema. Respiratory: Normal respiratory effort, no increased work of breathing. Neurologic: Grossly intact, no focal deficits, moving all 4 extremities. Psychiatric: Normal mood and affect.  Laboratory Data: Lab Results  Component Value Date   WBC 6.1 08/16/2021   HGB 13.5 08/16/2021   HCT 41.0 08/16/2021   MCV 99.3 08/16/2021   PLT 248 08/16/2021    Lab Results  Component Value Date   CREATININE 0.89 08/16/2021    Lab Results  Component Value Date   HGBA1C 5.1 08/16/2021       Component Value  Date/Time   CHOL 203 (H) 08/16/2021 0903   HDL 49 08/16/2021 0903   CHOLHDL 4.1 08/16/2021 0903   VLDL 27 12/28/2015 1046   LDLCALC 126 (H)  08/16/2021 2130    Lab Results  Component Value Date   AST 19 08/16/2021   Lab Results  Component Value Date   ALT 12 08/16/2021  I have reviewed the labs.   Pertinent Imaging: N/A  Assessment & Plan:    1. BPH with LUTS -PSA pending -symptoms -nocturia x1 -continue conservative management, avoiding bladder irritants and timed voiding's -Continue finasteride 5 mg daily   Return in about 1 year (around 12/14/2022) for IPSS, PSA .  These notes generated with voice recognition software. I apologize for typographical errors.  Michiel Cowboy, PA-C  [facility] [address]  [address] [place], [place] [address] [phone]

## 2021-12-13 ENCOUNTER — Encounter: Payer: Self-pay | Admitting: Urology

## 2021-12-13 ENCOUNTER — Ambulatory Visit: Payer: PPO | Admitting: Urology

## 2021-12-13 VITALS — BP 164/79 | HR 63 | Ht 70.0 in | Wt 141.0 lb

## 2021-12-13 DIAGNOSIS — N401 Enlarged prostate with lower urinary tract symptoms: Secondary | ICD-10-CM | POA: Diagnosis not present

## 2021-12-13 DIAGNOSIS — N138 Other obstructive and reflux uropathy: Secondary | ICD-10-CM | POA: Diagnosis not present

## 2021-12-14 LAB — PSA: Prostate Specific Ag, Serum: 2.1 ng/mL (ref 0.0–4.0)

## 2022-02-28 ENCOUNTER — Encounter: Payer: Self-pay | Admitting: Ophthalmology

## 2022-03-07 NOTE — Discharge Instructions (Signed)

## 2022-03-13 ENCOUNTER — Ambulatory Visit: Payer: PPO | Admitting: Anesthesiology

## 2022-03-13 ENCOUNTER — Ambulatory Visit (AMBULATORY_SURGERY_CENTER): Payer: PPO | Admitting: Anesthesiology

## 2022-03-13 ENCOUNTER — Ambulatory Visit
Admission: RE | Admit: 2022-03-13 | Discharge: 2022-03-13 | Disposition: A | Discharge status: Home / Self Care | Payer: PPO | Attending: Ophthalmology | Admitting: Ophthalmology

## 2022-03-13 ENCOUNTER — Encounter: Payer: Self-pay | Admitting: Ophthalmology

## 2022-03-13 ENCOUNTER — Other Ambulatory Visit: Payer: Self-pay

## 2022-03-13 ENCOUNTER — Encounter
Admission: RE | Disposition: A | Discharge status: Home / Self Care | Payer: Self-pay | Source: Home / Self Care | Attending: Ophthalmology

## 2022-03-13 DIAGNOSIS — J449 Chronic obstructive pulmonary disease, unspecified: Secondary | ICD-10-CM

## 2022-03-13 DIAGNOSIS — N289 Disorder of kidney and ureter, unspecified: Secondary | ICD-10-CM | POA: Diagnosis not present

## 2022-03-13 DIAGNOSIS — Z87891 Personal history of nicotine dependence: Secondary | ICD-10-CM

## 2022-03-13 DIAGNOSIS — H2512 Age-related nuclear cataract, left eye: Secondary | ICD-10-CM

## 2022-03-13 HISTORY — PX: CATARACT EXTRACTION W/PHACO: SHX586

## 2022-03-13 HISTORY — DX: Presence of external hearing-aid: Z97.4

## 2022-03-13 SURGERY — PHACOEMULSIFICATION, CATARACT, WITH IOL INSERTION
Anesthesia: Monitor Anesthesia Care | Site: Eye | Laterality: Left

## 2022-03-13 MED ORDER — LACTATED RINGERS IV SOLN: INTRAVENOUS | Status: DC

## 2022-03-13 MED ORDER — SIGHTPATH DOSE#1 BSS IO SOLN
INTRAOCULAR | Status: DC | PRN
  Administered 2022-03-13: 61 mL via OPHTHALMIC

## 2022-03-13 MED ORDER — ARMC OPHTHALMIC DILATING DROPS
1.0000 | OPHTHALMIC | Status: DC | PRN
  Administered 2022-03-13 (×3): 1 via OPHTHALMIC

## 2022-03-13 MED ORDER — TETRACAINE HCL 0.5 % OP SOLN
1.0000 [drp] | OPHTHALMIC | Status: DC | PRN
  Administered 2022-03-13 (×3): 1 [drp] via OPHTHALMIC

## 2022-03-13 MED ORDER — BRIMONIDINE TARTRATE-TIMOLOL 0.2-0.5 % OP SOLN
OPHTHALMIC | Status: DC | PRN
  Administered 2022-03-13: 1 [drp] via OPHTHALMIC

## 2022-03-13 MED ORDER — SIGHTPATH DOSE#1 NA CHONDROIT SULF-NA HYALURON 40-17 MG/ML IO SOLN
INTRAOCULAR | Status: DC | PRN
  Administered 2022-03-13: 1 mL via INTRAOCULAR

## 2022-03-13 MED ORDER — MIDAZOLAM HCL 2 MG/2ML IJ SOLN
INTRAMUSCULAR | Status: DC | PRN
  Administered 2022-03-13: 2 mg via INTRAVENOUS

## 2022-03-13 MED ORDER — SIGHTPATH DOSE#1 BSS IO SOLN
INTRAOCULAR | Status: DC | PRN
  Administered 2022-03-13: 15 mL

## 2022-03-13 MED ORDER — SIGHTPATH DOSE#1 BSS IO SOLN
INTRAOCULAR | Status: DC | PRN
  Administered 2022-03-13: 1 mL

## 2022-03-13 MED ORDER — MOXIFLOXACIN HCL 0.5 % OP SOLN
OPHTHALMIC | Status: DC | PRN
  Administered 2022-03-13: 0.2 mL via OPHTHALMIC

## 2022-03-13 SURGICAL SUPPLY — 16 items
CANNULA ANT/CHMB 27G (MISCELLANEOUS) IMPLANT
CANNULA ANT/CHMB 27GA (MISCELLANEOUS) IMPLANT
CATARACT SUITE SIGHTPATH (MISCELLANEOUS) ×1 IMPLANT
FEE CATARACT SUITE SIGHTPATH (MISCELLANEOUS) ×1 IMPLANT
GLOVE SURG ENC TEXT LTX SZ8 (GLOVE) ×1 IMPLANT
GLOVE SURG TRIUMPH 8.0 PF LTX (GLOVE) ×1 IMPLANT
LENS IOL TECNIS EYHANCE 23.0 (Intraocular Lens) IMPLANT
NDL FILTER BLUNT 18X1 1/2 (NEEDLE) ×1 IMPLANT
NEEDLE FILTER BLUNT 18X 1/2SAF (NEEDLE) ×1
NEEDLE FILTER BLUNT 18X1 1/2 (NEEDLE) ×1 IMPLANT
PACK VIT ANT 23G (MISCELLANEOUS) IMPLANT
RING MALYGIN (MISCELLANEOUS) IMPLANT
SUT ETHILON 10-0 CS-B-6CS-B-6 (SUTURE)
SUTURE EHLN 10-0 CS-B-6CS-B-6 (SUTURE) IMPLANT
SYR 3ML LL SCALE MARK (SYRINGE) ×1 IMPLANT
WATER STERILE IRR 250ML POUR (IV SOLUTION) ×1 IMPLANT

## 2022-03-13 NOTE — Op Note (Signed)
PREOPERATIVE DIAGNOSIS:  Nuclear sclerotic cataract of the left eye.   POSTOPERATIVE DIAGNOSIS:  Nuclear sclerotic cataract of the left eye.   OPERATIVE PROCEDURE:ORPROCALL@   SURGEON:  Birder Robson, MD.   ANESTHESIA:  Anesthesiologist: Martha Clan, MD CRNA: Tobie Poet, CRNA  1.      Managed anesthesia care. 2.     0.74m of Shugarcaine was instilled following the paracentesis   COMPLICATIONS:  None.   TECHNIQUE:   Stop and chop   DESCRIPTION OF PROCEDURE:  The patient was examined and consented in the preoperative holding area where the aforementioned topical anesthesia was applied to the left eye and then brought back to the Operating Room where the left eye was prepped and draped in the usual sterile ophthalmic fashion and a lid speculum was placed. A paracentesis was created with the side port blade and the anterior chamber was filled with viscoelastic. A near clear corneal incision was performed with the steel keratome. A continuous curvilinear capsulorrhexis was performed with a cystotome followed by the capsulorrhexis forceps. Hydrodissection and hydrodelineation were carried out with BSS on a blunt cannula. The lens was removed in a stop and chop  technique and the remaining cortical material was removed with the irrigation-aspiration handpiece. The capsular bag was inflated with viscoelastic and the Technis [id number] lens was placed in the capsular bag without complication. The remaining viscoelastic was removed from the eye with the irrigation-aspiration handpiece. The wounds were hydrated. The anterior chamber was flushed with BSS and the eye was inflated to physiologic pressure. 0.165mVigamox was placed in the anterior chamber. The wounds were found to be water tight. The eye was dressed with Combigan. The patient was given protective glasses to wear throughout the day and a shield with which to sleep tonight. The patient was also given drops with which to begin a drop  regimen today and will follow-up with me in one day. Implant Name Type Inv. Item Serial No. Manufacturer Lot No. LRB No. Used Action  LENS IOL TECNIS EYHANCE 23.0 - [id number] Lens LENS IOL TECNIS EYHANCE 23.0 [id number]  Left 1 Implanted    Procedure(s) with comments: CATARACT EXTRACTION PHACO AND INTRAOCULAR LENS PLACEMENT (IOC) LEFT MALYUGIN OMIDRIA (Left) - 13.87 1:11.1  Electronically signed: WiBirder Robson/11/2021 8:48 AM

## 2022-03-13 NOTE — Transfer of Care (Signed)
Immediate Anesthesia Transfer of Care Note  Patient: Jackson Coffey  Procedure(s) Performed: CATARACT EXTRACTION PHACO AND INTRAOCULAR LENS PLACEMENT ([place]) LEFT MALYUGIN OMIDRIA (Left: Eye)  Patient Location: PACU  Anesthesia Type: MAC  Level of Consciousness: awake, alert  and patient cooperative  Airway and Oxygen Therapy: Patient Spontanous Breathing and Patient connected to supplemental oxygen  Post-op Assessment: Post-op Vital signs reviewed, Patient's Cardiovascular Status Stable, Respiratory Function Stable, Patent Airway and No signs of Nausea or vomiting  Post-op Vital Signs: Reviewed and stable  Complications: No notable events documented.

## 2022-03-13 NOTE — Anesthesia Preprocedure Evaluation (Signed)
Anesthesia Evaluation  Patient identified by MRN, date of birth, ID band Patient awake    Reviewed: Allergy & Precautions, H&P , NPO status , Patient's Chart, lab work & pertinent test results  History of Anesthesia Complications Negative for: history of anesthetic complications  Airway Mallampati: III  TM Distance: >3 FB Neck ROM: limited    Dental  (+) Poor Dentition, Missing, Upper Dentures, Lower Dentures   Pulmonary neg shortness of breath, neg sleep apnea, COPD, neg recent URI, former smoker,    breath sounds clear to auscultation + decreased breath sounds      Cardiovascular Exercise Tolerance: Good (-) angina(-) Past MI negative cardio ROS Normal cardiovascular exam Rhythm:regular Rate:Normal     Neuro/Psych negative neurological ROS  negative psych ROS   GI/Hepatic negative GI ROS, Neg liver ROS, neg GERD  ,  Endo/Other  negative endocrine ROS  Renal/GU Renal disease  negative genitourinary   Musculoskeletal   Abdominal   Peds  Hematology negative hematology ROS (+)   Anesthesia Other Findings Past Medical History:   Elevated prostate specific antigen (PSA)                     Facial pain                                                  Visual changes                                               Kidney stones                                                Reproductive/Obstetrics negative OB ROS                             Anesthesia Physical  Anesthesia Plan  ASA: 2  Anesthesia Plan: MAC   Post-op Pain Management:    Induction: Intravenous  PONV Risk Score and Plan: 1 and Midazolam and Treatment may vary due to age or medical condition  Airway Management Planned: Natural Airway and Nasal Cannula  Additional Equipment:   Intra-op Plan:   Post-operative Plan:   Informed Consent: I have reviewed the patients History and Physical, chart, labs and discussed the  procedure including the risks, benefits and alternatives for the proposed anesthesia with the patient or authorized representative who has indicated his/her understanding and acceptance.     Dental Advisory Given  Plan Discussed with: Anesthesiologist, CRNA and Surgeon  Anesthesia Plan Comments:         Anesthesia Quick Evaluation

## 2022-03-13 NOTE — Anesthesia Postprocedure Evaluation (Signed)
Anesthesia Post Note  Patient: Jackson Coffey  Procedure(s) Performed: CATARACT EXTRACTION PHACO AND INTRAOCULAR LENS PLACEMENT ([place]) LEFT MALYUGIN OMIDRIA (Left: Eye)     Patient location during evaluation: PACU Anesthesia Type: MAC Level of consciousness: awake and alert Pain management: pain level controlled Vital Signs Assessment: post-procedure vital signs reviewed and stable Respiratory status: spontaneous breathing, nonlabored ventilation, respiratory function stable and patient connected to nasal cannula oxygen Cardiovascular status: stable and blood pressure returned to baseline Postop Assessment: no apparent nausea or vomiting Anesthetic complications: no   No notable events documented.  Martha Clan

## 2022-03-13 NOTE — H&P (Signed)
[facility]   Primary Care Physician:  Olin Hauser, DO Ophthalmologist: Dr. George Ina  Pre-Procedure History & Physical: HPI:  Jackson Coffey is a 81 y.o. male here for cataract surgery.   Past Medical History:  Diagnosis Date   Arthritis    Carotid atherosclerosis 12/28/2015   Do not resuscitate status 01/16/2017   Discussed with patient, has living will   Elevated PSA    Giant cell arteritis ([place]) 12/28/2015   Kidney stones    PIN III (prostatic intraepithelial neoplasia III) 12/28/2015   Skin cancer 05/31/2017   side of nose   Wears hearing aid in both ears     Past Surgical History:  Procedure Laterality Date   ARTERY BIOPSY Left 02/18/2015   Procedure: BIOPSY TEMPORAL ARTERY;  Surgeon: Algernon Huxley, MD;  Location: [facility] ORS;  Service: Vascular;  Laterality: Left;   CYSTOSCOPY     Dr. Jacqlyn Larsen, urologist   KIDNEY SURGERY     congential defect   MOHS SURGERY Left    left nasolabial fold   PROSTATE BIOPSY  2011, 2009   Dr. Jacqlyn Larsen, urologist    Prior to Admission medications   Medication Sig Start Date End Date Taking? Authorizing Provider  alendronate (FOSAMAX) 70 MG tablet Take 70 mg by mouth once a week. 07/03/20  Yes [provider]  aspirin 81 MG tablet Take 81 mg by mouth daily.   Yes [provider]  calcium carbonate (TUMS - DOSED IN MG ELEMENTAL CALCIUM) 500 MG chewable tablet Chew 1 tablet by mouth daily.   Yes [provider]  finasteride (PROSCAR) 5 MG tablet Take 1 tablet (5 mg total) by mouth daily. 06/14/21  Yes McGowan, Larene Beach A, PA-C  Multiple Vitamin (MULTIVITAMIN) tablet Take 1 tablet by mouth daily.   Yes [provider]  predniSONE (DELTASONE) 5 MG tablet Take 2.5 mg by mouth daily. One pill by mouth every other day, alternating with half of a pill the other days 06/03/17  Yes Lada, Satira Anis, MD  PRILOSEC 20 MG capsule Take 1 capsule (20 mg total) by mouth daily before breakfast. 02/10/20  Yes  Karamalegos, Devonne Doughty, DO  Ascorbic Acid (VITAMIN C) 100 MG tablet Take 100 mg by mouth daily. Patient not taking: Reported on 02/28/2022    [provider]    Allergies as of 01/24/2022 - Review Complete 12/13/2021  Allergen Reaction Noted   Geocillin [carbenicillin] Swelling 02/17/2015   Septra [sulfamethoxazole-trimethoprim] Swelling 02/17/2015   Tetracyclines & related Swelling 02/17/2015    Family History  Problem Relation Age of Onset   Parkinson's disease Mother 58   Heart attack Father 42   Heart disease Father    Thyroid disease Daughter    Stroke Maternal Grandmother    Heart disease Maternal Grandmother    Cancer Paternal Grandfather        unknown   Prostate cancer Paternal Grandfather     Social History   Socioeconomic History   Marital status: Married    Spouse name: Not on file   Number of children: Not on file   Years of education: Not on file   Highest education level: Not on file  Occupational History   Not on file  Tobacco Use   Smoking status: Former    Packs/day: 2.00    Types: Cigarettes    Quit date: 02/16/1977    Years since quitting: 45.0   Smokeless tobacco: Never  Vaping Use   Vaping Use: Never used  Substance and Sexual Activity   Alcohol use: Not Currently   Drug use: Never   Sexual activity: Not Currently  Other Topics Concern   Not on file  Social History Narrative   Not on file   Social Determinants of Health   Financial Resource Strain: Not on file  Food Insecurity: Not on file  Transportation Needs: Not on file  Physical Activity: Not on file  Stress: Not on file  Social Connections: Not on file  Intimate Partner Violence: Not on file    Review of Systems: See HPI, otherwise negative ROS  Physical Exam: BP (!) 168/82   Pulse 77   Temp 98.8 F (37.1 C) (Temporal)   Resp 16   Ht '5\' 10"'$  (1.778 m)   Wt 64 kg   SpO2 99%   BMI 20.23 kg/m  General:   Alert, cooperative in NAD Head:  Normocephalic and  atraumatic. Respiratory:  Normal work of breathing. Cardiovascular:  RRR  Impression/Plan: Jackson Coffey is here for cataract surgery.  Risks, benefits, limitations, and alternatives regarding cataract surgery have been reviewed with the patient.  Questions have been answered.  All parties agreeable.   Birder Robson, MD  03/13/2022, 8:20 AM

## 2022-03-14 ENCOUNTER — Encounter: Payer: Self-pay | Admitting: Ophthalmology

## 2022-03-21 ENCOUNTER — Encounter: Payer: Self-pay | Admitting: Ophthalmology

## 2022-03-21 NOTE — Discharge Instructions (Signed)

## 2022-03-27 ENCOUNTER — Encounter
Admission: RE | Disposition: A | Discharge status: Home / Self Care | Payer: Self-pay | Source: Home / Self Care | Attending: Ophthalmology

## 2022-03-27 ENCOUNTER — Other Ambulatory Visit: Payer: Self-pay

## 2022-03-27 ENCOUNTER — Ambulatory Visit (AMBULATORY_SURGERY_CENTER): Payer: PPO | Admitting: Anesthesiology

## 2022-03-27 ENCOUNTER — Ambulatory Visit: Payer: PPO | Admitting: Anesthesiology

## 2022-03-27 ENCOUNTER — Ambulatory Visit
Admission: RE | Admit: 2022-03-27 | Discharge: 2022-03-27 | Disposition: A | Discharge status: Home / Self Care | Payer: PPO | Attending: Ophthalmology | Admitting: Ophthalmology

## 2022-03-27 ENCOUNTER — Encounter: Payer: Self-pay | Admitting: Ophthalmology

## 2022-03-27 DIAGNOSIS — Z79899 Other long term (current) drug therapy: Secondary | ICD-10-CM | POA: Insufficient documentation

## 2022-03-27 DIAGNOSIS — H2511 Age-related nuclear cataract, right eye: Secondary | ICD-10-CM | POA: Diagnosis not present

## 2022-03-27 DIAGNOSIS — Z87891 Personal history of nicotine dependence: Secondary | ICD-10-CM

## 2022-03-27 DIAGNOSIS — I10 Essential (primary) hypertension: Secondary | ICD-10-CM

## 2022-03-27 DIAGNOSIS — E119 Type 2 diabetes mellitus without complications: Secondary | ICD-10-CM

## 2022-03-27 DIAGNOSIS — E1136 Type 2 diabetes mellitus with diabetic cataract: Secondary | ICD-10-CM | POA: Insufficient documentation

## 2022-03-27 HISTORY — PX: CATARACT EXTRACTION W/PHACO: SHX586

## 2022-03-27 SURGERY — PHACOEMULSIFICATION, CATARACT, WITH IOL INSERTION
Anesthesia: Topical | Site: Eye | Laterality: Right

## 2022-03-27 MED ORDER — SIGHTPATH DOSE#1 BSS IO SOLN
INTRAOCULAR | Status: DC | PRN
  Administered 2022-03-27: 2 mL

## 2022-03-27 MED ORDER — MOXIFLOXACIN HCL 0.5 % OP SOLN
OPHTHALMIC | Status: DC | PRN
  Administered 2022-03-27: 0.2 mL via OPHTHALMIC

## 2022-03-27 MED ORDER — TETRACAINE HCL 0.5 % OP SOLN
1.0000 [drp] | OPHTHALMIC | Status: DC | PRN
  Administered 2022-03-27 (×3): 1 [drp] via OPHTHALMIC

## 2022-03-27 MED ORDER — MIDAZOLAM HCL 2 MG/2ML IJ SOLN
INTRAMUSCULAR | Status: DC | PRN
  Administered 2022-03-27: 1 mg via INTRAVENOUS

## 2022-03-27 MED ORDER — FENTANYL CITRATE (PF) 100 MCG/2ML IJ SOLN
INTRAMUSCULAR | Status: DC | PRN
  Administered 2022-03-27: 50 ug via INTRAVENOUS

## 2022-03-27 MED ORDER — BRIMONIDINE TARTRATE-TIMOLOL 0.2-0.5 % OP SOLN
OPHTHALMIC | Status: DC | PRN
  Administered 2022-03-27: 1 [drp] via OPHTHALMIC

## 2022-03-27 MED ORDER — FENTANYL CITRATE PF 50 MCG/ML IJ SOSY: 25.0000 ug | PREFILLED_SYRINGE | INTRAMUSCULAR | Status: DC | PRN

## 2022-03-27 MED ORDER — SIGHTPATH DOSE#1 BSS IO SOLN
INTRAOCULAR | Status: DC | PRN
  Administered 2022-03-27: 15 mL via INTRAOCULAR

## 2022-03-27 MED ORDER — SIGHTPATH DOSE#1 NA CHONDROIT SULF-NA HYALURON 40-17 MG/ML IO SOLN
INTRAOCULAR | Status: DC | PRN
  Administered 2022-03-27: 1 mL via INTRAOCULAR

## 2022-03-27 MED ORDER — ARMC OPHTHALMIC DILATING DROPS
1.0000 | OPHTHALMIC | Status: DC | PRN
  Administered 2022-03-27 (×3): 1 via OPHTHALMIC

## 2022-03-27 MED ORDER — PHENYLEPHRINE-KETOROLAC 1-0.3 % IO SOLN
INTRAOCULAR | Status: DC | PRN
  Administered 2022-03-27: 56 mL via OPHTHALMIC

## 2022-03-27 MED ORDER — ONDANSETRON HCL 4 MG/2ML IJ SOLN
4.0000 mg | Freq: Once | INTRAMUSCULAR | Status: DC | PRN
Start: 2022-03-27 — End: 2022-03-27

## 2022-03-27 SURGICAL SUPPLY — 11 items
CANNULA ANT/CHMB 27G (MISCELLANEOUS) IMPLANT
CANNULA ANT/CHMB 27GA (MISCELLANEOUS) IMPLANT
CATARACT SUITE SIGHTPATH (MISCELLANEOUS) ×1 IMPLANT
FEE CATARACT SUITE SIGHTPATH (MISCELLANEOUS) ×1 IMPLANT
GLOVE SURG ENC TEXT LTX SZ8 (GLOVE) ×1 IMPLANT
GLOVE SURG TRIUMPH 8.0 PF LTX (GLOVE) ×1 IMPLANT
LENS IOL TECNIS EYHANCE 22.5 (Intraocular Lens) IMPLANT
NDL FILTER BLUNT 18X1 1/2 (NEEDLE) ×1 IMPLANT
NEEDLE FILTER BLUNT 18X1 1/2 (NEEDLE) ×1 IMPLANT
SYR 3ML LL SCALE MARK (SYRINGE) ×1 IMPLANT
WATER STERILE IRR 250ML POUR (IV SOLUTION) ×1 IMPLANT

## 2022-03-27 NOTE — Anesthesia Postprocedure Evaluation (Signed)
Anesthesia Post Note  Patient: Jackson Coffey  Procedure(s) Performed: CATARACT EXTRACTION PHACO AND INTRAOCULAR LENS PLACEMENT ([place]) RIGHT OMIDRIA 9.38 00:47.8 (Right: Eye)     Patient location during evaluation: PACU Anesthesia Type: General Level of consciousness: awake and alert Pain management: pain level controlled Vital Signs Assessment: post-procedure vital signs reviewed and stable Respiratory status: spontaneous breathing, nonlabored ventilation, respiratory function stable and patient connected to nasal cannula oxygen Cardiovascular status: blood pressure returned to baseline and stable Postop Assessment: no apparent nausea or vomiting Anesthetic complications: no   There were no known notable events for this encounter.  Molli Barrows

## 2022-03-27 NOTE — Transfer of Care (Signed)
Immediate Anesthesia Transfer of Care Note  Patient: Jackson Coffey  Procedure(s) Performed: CATARACT EXTRACTION PHACO AND INTRAOCULAR LENS PLACEMENT ([facility]) RIGHT OMIDRIA 9.38 00:47.8 (Right: Eye)  Patient Location: PACU  Anesthesia Type: No value filed.  Level of Consciousness: awake, alert  and patient cooperative  Airway and Oxygen Therapy: Patient Spontanous Breathing and Patient connected to supplemental oxygen  Post-op Assessment: Post-op Vital signs reviewed, Patient's Cardiovascular Status Stable, Respiratory Function Stable, Patent Airway and No signs of Nausea or vomiting  Post-op Vital Signs: Reviewed and stable  Complications: There were no known notable events for this encounter.

## 2022-03-27 NOTE — Anesthesia Preprocedure Evaluation (Signed)
Anesthesia Evaluation  Patient identified by MRN, date of birth, ID band Patient awake    Reviewed: Allergy & Precautions, H&P , NPO status , Patient's Chart, lab work & pertinent test results, reviewed documented beta blocker date and time   Airway Mallampati: II  TM Distance: >3 FB Neck ROM: full    Dental no notable dental hx. (+) Teeth Intact   Pulmonary neg pulmonary ROS, former smoker,    Pulmonary exam normal breath sounds clear to auscultation       Cardiovascular Exercise Tolerance: Good hypertension, negative cardio ROS   Rhythm:regular Rate:Normal     Neuro/Psych negative neurological ROS  negative psych ROS   GI/Hepatic negative GI ROS, Neg liver ROS,   Endo/Other  negative endocrine ROSdiabetes  Renal/GU      Musculoskeletal   Abdominal   Peds  Hematology negative hematology ROS (+)   Anesthesia Other Findings   Reproductive/Obstetrics negative OB ROS                             Anesthesia Physical Anesthesia Plan  ASA: 3  Anesthesia Plan: MAC   Post-op Pain Management:    Induction:   PONV Risk Score and Plan:   Airway Management Planned:   Additional Equipment:   Intra-op Plan:   Post-operative Plan:   Informed Consent: I have reviewed the patients History and Physical, chart, labs and discussed the procedure including the risks, benefits and alternatives for the proposed anesthesia with the patient or authorized representative who has indicated his/her understanding and acceptance.       Plan Discussed with: CRNA  Anesthesia Plan Comments:         Anesthesia Quick Evaluation

## 2022-03-27 NOTE — H&P (Signed)
[facility]   Primary Care Physician:  Olin Hauser, DO Ophthalmologist: Dr.Tehila Sokolow  Pre-Procedure History & Physical: HPI:  Jackson Coffey is a 81 y.o. male here for cataract surgery.   Past Medical History:  Diagnosis Date   Arthritis    Carotid atherosclerosis 12/28/2015   Do not resuscitate status 01/16/2017   Discussed with patient, has living will   Elevated PSA    Giant cell arteritis ([place]) 12/28/2015   Kidney stones    PIN III (prostatic intraepithelial neoplasia III) 12/28/2015   Skin cancer 05/31/2017   side of nose   Wears hearing aid in both ears     Past Surgical History:  Procedure Laterality Date   ARTERY BIOPSY Left 02/18/2015   Procedure: BIOPSY TEMPORAL ARTERY;  Surgeon: Algernon Huxley, MD;  Location: [facility];  Service: Vascular;  Laterality: Left;   CATARACT EXTRACTION W/PHACO Left 03/13/2022   Procedure: CATARACT EXTRACTION PHACO AND INTRAOCULAR LENS PLACEMENT ([place]) LEFT MALYUGIN OMIDRIA;  Surgeon: Birder Robson, MD;  Location: [place];  Service: Ophthalmology;  Laterality: Left;  13.87 1:11.1   CYSTOSCOPY     Dr. Jacqlyn Larsen, urologist   KIDNEY SURGERY     congential defect   MOHS SURGERY Left    left nasolabial fold   PROSTATE BIOPSY  2011, 2009   Dr. Jacqlyn Larsen, urologist    Prior to Admission medications   Medication Sig Start Date End Date Taking? Authorizing Provider  alendronate (FOSAMAX) 70 MG tablet Take 70 mg by mouth once a week. 07/03/20  Yes [provider]  Ascorbic Acid (VITAMIN C) 100 MG tablet Take 100 mg by mouth daily.   Yes [provider]  aspirin 81 MG tablet Take 81 mg by mouth daily.   Yes [provider]  calcium carbonate (TUMS - DOSED IN MG ELEMENTAL CALCIUM) 500 MG chewable tablet Chew 1 tablet by mouth daily.   Yes [provider]  finasteride (PROSCAR) 5 MG tablet Take 1 tablet (5 mg total) by mouth daily. 06/14/21  Yes McGowan, Larene Beach A, PA-C  Multiple Vitamin  (MULTIVITAMIN) tablet Take 1 tablet by mouth daily.   Yes [provider]  predniSONE (DELTASONE) 5 MG tablet Take 2.5 mg by mouth daily. One pill by mouth every other day, alternating with half of a pill the other days 06/03/17  Yes Lada, Satira Anis, MD  PRILOSEC 20 MG capsule Take 1 capsule (20 mg total) by mouth daily before breakfast. 02/10/20  Yes Karamalegos, Devonne Doughty, DO    Allergies as of 01/24/2022 - Review Complete 12/13/2021  Allergen Reaction Noted   Geocillin [carbenicillin] Swelling 02/17/2015   Septra [sulfamethoxazole-trimethoprim] Swelling 02/17/2015   Tetracyclines & related Swelling 02/17/2015    Family History  Problem Relation Age of Onset   Parkinson's disease Mother 60   Heart attack Father 52   Heart disease Father    Thyroid disease Daughter    Stroke Maternal Grandmother    Heart disease Maternal Grandmother    Cancer Paternal Grandfather        unknown   Prostate cancer Paternal Grandfather     Social History   Socioeconomic History   Marital status: Married    Spouse name: Not on file   Number of children: Not on file   Years of education: Not on file   Highest education level: Not on file  Occupational History   Not on file  Tobacco Use   Smoking status: Former    Packs/day: 2.00  Types: Cigarettes    Quit date: 02/16/1977    Years since quitting: 45.1   Smokeless tobacco: Never  Vaping Use   Vaping Use: Never used  Substance and Sexual Activity   Alcohol use: Not Currently   Drug use: Never   Sexual activity: Not Currently  Other Topics Concern   Not on file  Social History Narrative   Not on file   Social Determinants of Health   Financial Resource Strain: Not on file  Food Insecurity: Not on file  Transportation Needs: Not on file  Physical Activity: Not on file  Stress: Not on file  Social Connections: Not on file  Intimate Partner Violence: Not on file    Review of Systems: See HPI, otherwise negative  ROS  Physical Exam: BP (!) 159/81   Pulse 64   Temp 97.7 F (36.5 C) (Temporal)   Resp 18   Ht '5\' 10"'$  (1.778 m)   Wt 64 kg   SpO2 100%   BMI 20.23 kg/m  General:   Alert, cooperative in NAD Head:  Normocephalic and atraumatic. Respiratory:  Normal work of breathing. Cardiovascular:  RRR  Impression/Plan: Jackson Coffey is here for cataract surgery.  Risks, benefits, limitations, and alternatives regarding cataract surgery have been reviewed with the patient.  Questions have been answered.  All parties agreeable.   Birder Robson, MD  03/27/2022, 11:29 AM

## 2022-03-27 NOTE — Op Note (Signed)
PREOPERATIVE DIAGNOSIS:  Nuclear sclerotic cataract of the right eye.   POSTOPERATIVE DIAGNOSIS:  Cataract   OPERATIVE PROCEDURE:ORPROCALL@   SURGEON:  Birder Robson, MD.   ANESTHESIA:  Anesthesiologist: Molli Barrows, MD CRNA: Ester Rink, CRNA  1.      Managed anesthesia care. 2.      0.78m of Shugarcaine was instilled in the eye following the paracentesis.   COMPLICATIONS:  None.   TECHNIQUE:   Stop and chop   DESCRIPTION OF PROCEDURE:  The patient was examined and consented in the preoperative holding area where the aforementioned topical anesthesia was applied to the right eye and then brought back to the Operating Room where the right eye was prepped and draped in the usual sterile ophthalmic fashion and a lid speculum was placed. A paracentesis was created with the side port blade and the anterior chamber was filled with viscoelastic. A near clear corneal incision was performed with the steel keratome. A continuous curvilinear capsulorrhexis was performed with a cystotome followed by the capsulorrhexis forceps. Hydrodissection and hydrodelineation were carried out with BSS on a blunt cannula. The lens was removed in a stop and chop  technique and the remaining cortical material was removed with the irrigation-aspiration handpiece. The capsular bag was inflated with viscoelastic and the Technis [id number]  lens was placed in the capsular bag without complication. The remaining viscoelastic was removed from the eye with the irrigation-aspiration handpiece. The wounds were hydrated. The anterior chamber was flushed with BSS and the eye was inflated to physiologic pressure. 0.133mof Vigamox was placed in the anterior chamber. The wounds were found to be water tight. The eye was dressed with Combigan. The patient was given protective glasses to wear throughout the day and a shield with which to sleep tonight. The patient was also given drops with which to begin a drop regimen today and will  follow-up with me in one day. Implant Name Type Inv. Item Serial No. Manufacturer Lot No. LRB No. Used Action  LENS IOL TECNIS EYHANCE 22.5 - [id number] Lens LENS IOL TECNIS EYHANCE 22.5 [id number]  Right 1 Implanted   Procedure(s): CATARACT EXTRACTION PHACO AND INTRAOCULAR LENS PLACEMENT ([facility]) RIGHT OMIDRIA 9.38 00:47.8 (Right)  Electronically signed: WiBirder Robson/19/2023 11:55 AM

## 2022-03-28 ENCOUNTER — Encounter: Payer: Self-pay | Admitting: Ophthalmology

## 2022-05-02 ENCOUNTER — Telehealth: Payer: Self-pay | Admitting: Family Medicine

## 2022-05-02 NOTE — Telephone Encounter (Signed)
Left message for patient to call back and schedule the Medicare Annual Wellness Visit (AWV) virtually or by telephone.  Last AWV 01/16/17  Please schedule at anytime with [facility]Nurse Health Advisor.    Any questions, please call me at [phone] 

## 2022-05-03 ENCOUNTER — Encounter: Payer: Self-pay | Admitting: Family Medicine

## 2022-06-22 ENCOUNTER — Telehealth: Payer: Self-pay | Admitting: Family Medicine

## 2022-06-22 NOTE — Telephone Encounter (Signed)
Patients wife Helene Kelp states that he does not do the medicare AWV.

## 2022-06-22 NOTE — Telephone Encounter (Signed)
Left message for patient to call back and schedule the Medicare Annual Wellness Visit (AWV) virtually or by telephone.  Last AWV 01/16/17  Please schedule at anytime with [place].    Any questions, please call me at [phone]

## 2022-07-14 DIAGNOSIS — N400 Enlarged prostate without lower urinary tract symptoms: Secondary | ICD-10-CM

## 2022-07-16 MED ORDER — FINASTERIDE 5 MG PO TABS: 5.0000 mg | ORAL_TABLET | Freq: Every day | ORAL | 1 refills | Status: DC

## 2022-07-31 DIAGNOSIS — Z7952 Long term (current) use of systemic steroids: Secondary | ICD-10-CM | POA: Diagnosis not present

## 2022-07-31 DIAGNOSIS — M316 Other giant cell arteritis: Secondary | ICD-10-CM | POA: Diagnosis not present

## 2022-08-15 ENCOUNTER — Ambulatory Visit (INDEPENDENT_AMBULATORY_CARE_PROVIDER_SITE_OTHER): Payer: PPO | Admitting: Family Medicine

## 2022-08-15 ENCOUNTER — Encounter: Payer: Self-pay | Admitting: Family Medicine

## 2022-08-15 VITALS — BP 112/78 | HR 56 | Ht 70.0 in | Wt 146.8 lb

## 2022-08-15 DIAGNOSIS — Z0000 Encounter for general adult medical examination without abnormal findings: Secondary | ICD-10-CM | POA: Diagnosis not present

## 2022-08-15 DIAGNOSIS — Z79899 Other long term (current) drug therapy: Secondary | ICD-10-CM

## 2022-08-15 DIAGNOSIS — E7800 Pure hypercholesterolemia, unspecified: Secondary | ICD-10-CM

## 2022-08-15 DIAGNOSIS — R7309 Other abnormal glucose: Secondary | ICD-10-CM | POA: Diagnosis not present

## 2022-08-15 DIAGNOSIS — M316 Other giant cell arteritis: Secondary | ICD-10-CM

## 2022-08-15 DIAGNOSIS — H9113 Presbycusis, bilateral: Secondary | ICD-10-CM

## 2022-08-15 DIAGNOSIS — I6523 Occlusion and stenosis of bilateral carotid arteries: Secondary | ICD-10-CM

## 2022-08-15 DIAGNOSIS — M810 Age-related osteoporosis without current pathological fracture: Secondary | ICD-10-CM

## 2022-08-15 DIAGNOSIS — R972 Elevated prostate specific antigen [PSA]: Secondary | ICD-10-CM | POA: Diagnosis not present

## 2022-08-15 DIAGNOSIS — N400 Enlarged prostate without lower urinary tract symptoms: Secondary | ICD-10-CM | POA: Diagnosis not present

## 2022-08-15 NOTE — Progress Notes (Unsigned)
Subjective:    Patient ID: Jackson Coffey, male    DOB: [birth year], 82 y.o.   MRN: [id number]  Jackson Coffey is a 82 y.o. male presenting on 08/15/2022 for Annual Exam   HPI  Here for Annual Physical and Lab Orders  Specialist Rheumatology - Followed by Dr [facility]) Urology - Followed by [facility]  Prior history 240 wt, he has lost weight chronically over the years, and he has tried to maintain weight and now has had some episodic weight gain.   Temporal Arteritis / Osteoporosis On chronic prednisone for past 5-6 years, initially high dose Prednisone '60mg'$  for weeks, had headache, had vision loss in past as well has regained, has adjusted dosing. He was previously alternating Prednisone '5mg'$  alternating with 2.5 mg dose previously.  Recent update per Rheum 07/31/22 now he has been lowered to '2mg'$  daily dose.  Actemra med autoinjector, considering this. But it is high cost $1200 per shot and not covered by insurance.  Mild Anemia, Stable Hgb 13    Updated DEXA has improved.   Cramps Previously on Flexeril PRN for muscle aches cramps in legs at night PRN, worse in past with inc activity.Not taking regularly now   GERD Omeprazole '20mg'$  daily OTC to prevent side effect from chronic prednisone.   BPH / High Grade PIN / Elevated PSA / History of Kidney Stones Followed by Urology PSA surveillance last 2.1 (12/2021) - History of prior kidney stones Past few years, has had elevated PSA. Had prostate biopsy multiple times, has had some High Grade PIN and pre-cancerous cells also suspicious abnormal cells. Had improved PSA On Finasteride doing well, chronically Admits nocturia x 1 Prior kidney surgery age 3, had a congenital Left UPJ artery crossing issue, prior Dr Madelin Headings   Elevated BP No known history of Hypertension. Has had occasional elevated reading. Often improves on repeat. home readings well controlled, match BP reading here    Carotid Atherosclerosis He is on ASA 81 Not on statin therapy Previously imaged, with history of Temporal arteritis   Former smoker Reports history of possible COPD. But he says this has not been confirmed and he has no problems with this. No prior imaging to support.   Health Maintenance: Updated Flu Vaccine  History of Zostavax, but unsure about Shingrix      08/16/2021    8:36 AM 02/10/2020   10:30 AM 04/30/2018    8:50 AM  Depression screen PHQ 2/9  Decreased Interest 0 0 0  Down, Depressed, Hopeless 0 0 0  PHQ - 2 Score 0 0 0  Altered sleeping   0  Tired, decreased energy   0  Change in appetite   0  Feeling bad or failure about yourself    0  Trouble concentrating   0  Moving slowly or fidgety/restless   0  Suicidal thoughts   0  PHQ-9 Score   0  Difficult doing work/chores   Not difficult at all    Past Medical History:  Diagnosis Date   Arthritis    Carotid atherosclerosis 12/28/2015   Do not resuscitate status 01/16/2017   Discussed with patient, has living will   Elevated PSA    Giant cell arteritis ([place]) 12/28/2015   Kidney stones    PIN III (prostatic intraepithelial neoplasia III) 12/28/2015   Skin cancer 05/31/2017   side of nose   Wears hearing aid in both ears    Past Surgical History:  Procedure Laterality Date  ARTERY BIOPSY Left 02/18/2015   Procedure: BIOPSY TEMPORAL ARTERY;  Surgeon: Algernon Huxley, MD;  Location: [facility];  Service: Vascular;  Laterality: Left;   CATARACT EXTRACTION W/PHACO Left 03/13/2022   Procedure: CATARACT EXTRACTION PHACO AND INTRAOCULAR LENS PLACEMENT ([place]) LEFT MALYUGIN OMIDRIA;  Surgeon: Birder Robson, MD;  Location: [place];  Service: Ophthalmology;  Laterality: Left;  13.87 1:11.1   CATARACT EXTRACTION W/PHACO Right 03/27/2022   Procedure: CATARACT EXTRACTION PHACO AND INTRAOCULAR LENS PLACEMENT ([facility]) RIGHT OMIDRIA 9.38 00:47.8;  Surgeon: Birder Robson, MD;  Location: [place];  Service:  Ophthalmology;  Laterality: Right;   CYSTOSCOPY     Dr. Jacqlyn Larsen, urologist   KIDNEY SURGERY     congential defect   MOHS SURGERY Left    left nasolabial fold   PROSTATE BIOPSY  2011, 2009   Dr. Jacqlyn Larsen, urologist   Social History   Socioeconomic History   Marital status: Married    Spouse name: Not on file   Number of children: Not on file   Years of education: Not on file   Highest education level: Not on file  Occupational History   Not on file  Tobacco Use   Smoking status: Former    Packs/day: 2.00    Types: Cigarettes    Quit date: 02/16/1977    Years since quitting: 45.5   Smokeless tobacco: Never  Vaping Use   Vaping Use: Never used  Substance and Sexual Activity   Alcohol use: Not Currently   Drug use: Never   Sexual activity: Not Currently  Other Topics Concern   Not on file  Social History Narrative   Not on file   Social Determinants of Health   Financial Resource Strain: Not on file  Food Insecurity: Not on file  Transportation Needs: Not on file  Physical Activity: Not on file  Stress: Not on file  Social Connections: Not on file  Intimate Partner Violence: Not on file   Family History  Problem Relation Age of Onset   Parkinson's disease Mother 25   Heart attack Father 24   Heart disease Father    Thyroid disease Daughter    Stroke Maternal Grandmother    Heart disease Maternal Grandmother    Cancer Paternal Grandfather        unknown   Prostate cancer Paternal Grandfather    Current Outpatient Medications on File Prior to Visit  Medication Sig   alendronate (FOSAMAX) 70 MG tablet Take 70 mg by mouth once a week.   Ascorbic Acid (VITAMIN C) 100 MG tablet Take 100 mg by mouth daily.   aspirin 81 MG tablet Take 81 mg by mouth daily.   calcium carbonate (TUMS - DOSED IN MG ELEMENTAL CALCIUM) 500 MG chewable tablet Chew 1 tablet by mouth daily.   finasteride (PROSCAR) 5 MG tablet Take 1 tablet (5 mg total) by mouth daily.   Multiple Vitamin  (MULTIVITAMIN) tablet Take 1 tablet by mouth daily.   predniSONE 2 MG TBEC    PRILOSEC 20 MG capsule Take 1 capsule (20 mg total) by mouth daily before breakfast.   No current facility-administered medications on file prior to visit.    Review of Systems  Constitutional:  Negative for activity change, appetite change, chills, diaphoresis, fatigue and fever.  HENT:  Negative for congestion and hearing loss.   Eyes:  Negative for visual disturbance.  Respiratory:  Negative for cough, chest tightness, shortness of breath and wheezing.   Cardiovascular:  Negative for chest  pain, palpitations and leg swelling.  Gastrointestinal:  Negative for abdominal pain, constipation, diarrhea, nausea and vomiting.  Genitourinary:  Negative for dysuria, frequency and hematuria.  Musculoskeletal:  Negative for arthralgias and neck pain.  Skin:  Negative for rash.  Neurological:  Negative for dizziness, weakness, light-headedness, numbness and headaches.  Hematological:  Negative for adenopathy.  Psychiatric/Behavioral:  Negative for behavioral problems, dysphoric mood and sleep disturbance.    Per HPI unless specifically indicated above      Objective:    BP 112/78   Pulse (!) 56   Ht '5\' 10"'$  (1.778 m)   Wt 146 lb 12.8 oz (66.6 kg)   SpO2 99%   BMI 21.06 kg/m   Wt Readings from Last 3 Encounters:  08/15/22 146 lb 12.8 oz (66.6 kg)  03/27/22 141 lb (64 kg)  03/13/22 141 lb (64 kg)    Physical Exam Vitals and nursing note reviewed.  Constitutional:      General: He is not in acute distress.    Appearance: He is well-developed. He is not diaphoretic.     Comments: Well-appearing thin appearing but stable weight, elderly 82 yr male, comfortable, cooperative  HENT:     Head: Normocephalic and atraumatic.  Eyes:     General:        Right eye: No discharge.        Left eye: No discharge.     Conjunctiva/sclera: Conjunctivae normal.     Pupils: Pupils are equal, round, and reactive to light.   Neck:     Thyroid: No thyromegaly.     Vascular: No carotid bruit.  Cardiovascular:     Rate and Rhythm: Normal rate and regular rhythm.     Pulses: Normal pulses.     Heart sounds: Normal heart sounds. No murmur heard. Pulmonary:     Effort: Pulmonary effort is normal. No respiratory distress.     Breath sounds: Normal breath sounds. No wheezing or rales.  Abdominal:     General: Bowel sounds are normal. There is no distension.     Palpations: Abdomen is soft. There is no mass.     Tenderness: There is no abdominal tenderness.  Musculoskeletal:        General: No tenderness. Normal range of motion.     Cervical back: Normal range of motion and neck supple.     Right lower leg: No edema.     Left lower leg: No edema.     Comments: Upper / Lower Extremities: - Normal muscle tone, strength bilateral upper extremities 5/5, lower extremities 5/5  Lymphadenopathy:     Cervical: No cervical adenopathy.  Skin:    General: Skin is warm and dry.     Findings: No erythema or rash.  Neurological:     Mental Status: He is alert and oriented to person, place, and time.     Comments: Distal sensation intact to light touch all extremities  Psychiatric:        Mood and Affect: Mood normal.        Behavior: Behavior normal.        Thought Content: Thought content normal.     Comments: Well groomed, good eye contact, normal speech and thoughts     Results for orders placed or performed in visit on 12/13/21  PSA  Result Value Ref Range   Prostate Specific Ag, Serum 2.1 0.0 - 4.0 ng/mL      Assessment & Plan:   Problem List Items Addressed This Visit  BPH with elevated PSA    Followed by [facility] Controlled BPH on finasteride Elevated PSA in setting of abnormal prostate biopsy with suspicious cells / PIN high grade, no confirmation of prostate cancer. Continues on Finasteride '5mg'$  F/u PSA trend w/ Urology      Carotid atherosclerosis   Relevant Orders   Lipid panel   TSH    GCA (giant cell arteritis) ([place])    Chronic problem over past several years since 2016-17 Follow[facility] on prednisone, gradual lower dose Review records No longer having active symptoms, no headache or vision loss      Relevant Medications   predniSONE 2 MG TBEC   Other Relevant Orders   BASIC METABOLIC PANEL WITH GFR   Osteoporosis of lumbar spine    Chronic problem, likely secondary to chronic prednisone high risk med and age No known fractures due to OP Failed Fosamax due to myalgia and complications Followed by Rheum, monitoring DEXA      Presbycusis   Other Visit Diagnoses     Annual physical exam    -  Primary   Relevant Orders   Lipid panel   TSH   BASIC METABOLIC PANEL WITH GFR   Abnormal glucose       High risk medication use       Relevant Orders   Lipid panel   TSH   BASIC METABOLIC PANEL WITH GFR   Pure hypercholesterolemia       Relevant Orders   Lipid panel   TSH       Updated Health Maintenance information Fasting lab only today Encouraged improvement to lifestyle with diet and exercise Goal of weight loss  Counseling on limiting kidney stone w dietary measures, see AVS  No orders of the defined types were placed in this encounter.     Follow up plan: Return in about 1 year (around 08/16/2023) for 1 year Annual Physical AM lab fasting after.  Nobie Putnam, [facility] 08/15/2022, 8:24 AM

## 2022-08-15 NOTE — Patient Instructions (Addendum)
Thank you for coming to the office today.   ---------------------------  Check with Urology on further advice.  Calcium Citrate is the preferred option for Calcium supplement. Higher risk with Calcium Carbonate TUMS  Some foods contain very large amounts of oxalate, and those should be avoided (eg, spinach, rhubarb, potatoes). In addition, some nuts and legumes are also high (eg, almonds) or moderate (eg, peanuts, cashews) in oxalate, and their intake should be limited. High-oxalate foods should be avoided regardless of urine oxalate since there may be a period of very high urinary oxalate excretion soon after the food is consumed  Caution with Tea (high oxalate)   Please schedule a Follow-up Appointment to: Return in about 1 year (around 08/16/2023) for 1 year Annual Physical AM lab fasting after.  If you have any other questions or concerns, please feel free to call the office or send a message through [place]. You may also schedule an earlier appointment if necessary.  Additionally, you may be receiving a survey about your experience at our office within a few days to 1 week by e-mail or mail. We value your feedback.  Nobie Putnam, DO [facility], [facility]  Dietary Guidelines to Help Prevent Kidney Stones Kidney stones are deposits of minerals and salts that form inside your kidneys. Your risk of developing kidney stones may be greater depending on your diet, your lifestyle, the medicines you take, and whether you have certain medical conditions. Most people can lower their risks of developing kidney stones by following these dietary guidelines. Your dietitian may give you more specific instructions depending on your overall health and the type of kidney stones you tend to develop. What are tips for following this plan? Reading food labels  Choose foods with "no salt added" or "low-salt" labels. Limit your salt (sodium) intake to less than 1,500 mg a day. Choose  foods with calcium for each meal and snack. Try to eat about 300 mg of calcium at each meal. Foods that contain 200-500 mg of calcium a serving include: 8 oz (237 mL) of milk, calcium-fortifiednon-dairy milk, and calcium-fortifiedfruit juice. Calcium-fortified means that calcium has been added to these drinks. 8 oz (237 mL) of kefir, yogurt, and soy yogurt. 4 oz (114 g) of tofu. 1 oz (28 g) of cheese. 1 cup (150 g) of dried figs. 1 cup (91 g) of cooked broccoli. One 3 oz (85 g) can of sardines or mackerel. Most people need 1,000-1,500 mg of calcium a day. Talk to your dietitian about how much calcium is recommended for you. Shopping Buy plenty of fresh fruits and vegetables. Most people do not need to avoid fruits and vegetables, even if these foods contain nutrients that may contribute to kidney stones. When shopping for convenience foods, choose: Whole pieces of fruit. Pre-made salads with dressing on the side. Low-fat fruit and yogurt smoothies. Avoid buying frozen meals or prepared deli foods. These can be high in sodium. Look for foods with live cultures, such as yogurt and kefir. Choose high-fiber grains, such as whole-wheat breads, oat bran, and wheat cereals. Cooking Do not add salt to food when cooking. Place a salt shaker on the table and allow each person to add their own salt to taste. Use vegetable protein, such as beans, textured vegetable protein (TVP), or tofu, instead of meat in pasta, casseroles, and soups. Meal planning Eat less salt, if told by your dietitian. To do this: Avoid eating processed or pre-made food. Avoid eating fast food. Eat less animal  protein, including cheese, meat, poultry, or fish, if told by your dietitian. To do this: Limit the number of times you have meat, poultry, fish, or cheese each week. Eat a diet free of meat at least 2 days a week. Eat only one serving each day of meat, poultry, fish, or seafood. When you prepare animal proteins, cut  pieces into small portion sizes. For most meat and fish, one serving is about the size of the palm of your hand. Eat at least five servings of fresh fruits and vegetables each day. To do this: Keep fruits and vegetables on hand for snacks. Eat one piece of fruit or a handful of berries with breakfast. Have a salad and fruit at lunch. Have two kinds of vegetables at dinner. You may be told to limit foods that are high in a substance called oxalate. These include: Spinach (cooked), rhubarb, beets, sweet potatoes, and Swiss chard. Peanuts. Potato chips, french fries, and baked potatoes with skin on. Nuts and nut products. Chocolate. If you regularly take a diuretic medicine, make sure to eat at least 1 or 2 servings of fruits or vegetables that are high in potassium each day. These include: Avocado. Banana. Orange, prune, carrot, or tomato juice. Baked potato. Cabbage. Beans and split peas. Lifestyle  Drink enough fluid to keep your urine pale yellow. This is the most important thing you can do. Spread your fluid intake throughout the day. If you drink alcohol: Limit how much you have to: 0-1 drink a day for women who are not pregnant. 0-2 drinks a day for men. Know how much alcohol is in your drink. In the [place] one drink equals one 12 oz bottle of beer (355 mL), one 5 oz glass of wine (148 mL), or one 1 oz glass of hard liquor (44 mL). Lose weight if told by your health care provider. Work with your dietitian to find an eating plan and weight loss strategies that work best for you. General information Talk to your health care provider and dietitian about taking daily supplements. Depending on your health and the cause of your kidney stones, you may be told: Do not take high-dose supplements of vitamin C (1,000 mg a day or more). To take a calcium supplement. To take a daily probiotic supplement. To take other supplements such as magnesium, fish oil, or vitamin B6. Take  over-the-counter and prescription medicines only as told by your health care provider. These include supplements. What foods should I limit? Limit your intake of the following foods, or eat them as told by your dietitian. Vegetables Spinach. Rhubarb. Beets. Canned vegetables. Angie Fava. Olives. Baked potatoes with skin. Grains Wheat bran. Baked goods. Salted crackers. Cereals high in sugar. Meats and other proteins Nuts. Nut butters. Large portions of meat, poultry, or fish. Salted, precooked, or cured meats, such as sausages, meat loaves, and hot dogs. Dairy Cheeses. Beverages Regular soft drinks. Regular vegetable juice. Seasonings and condiments Seasoning blends with salt. Salad dressings. Soy sauce. Ketchup. Barbecue sauce. Other foods Canned soups. Canned pasta sauce. Casseroles. Pizza. Lasagna. Frozen meals. Potato chips. [place] fries. The items listed above may not be a complete list of foods and beverages you should limit. Contact a dietitian for more information. What foods should I avoid? Talk to your dietitian about specific foods you should avoid based on the type of kidney stones you have and your overall health. Fruits Grapefruit. The item listed above may not be a complete list of foods and beverages you should avoid.  Contact a dietitian for more information. Summary Kidney stones are deposits of minerals and salts that form inside your kidneys. You can lower your risk of kidney stones by making changes to your diet. The most important thing you can do is drink enough fluid. Drink enough fluid to keep your urine pale yellow. Talk to your dietitian about how much calcium you should have each day, and eat less salt and animal protein as told by your dietitian. This information is not intended to replace advice given to you by your health care provider. Make sure you discuss any questions you have with your health care provider. Document Revised: 10/05/2021 Document Reviewed:  10/05/2021 Elsevier Patient Education  [place].

## 2022-08-16 LAB — BASIC METABOLIC PANEL WITH GFR
BUN: 19 mg/dL (ref 7–25)
CO2: 32 mmol/L (ref 20–32)
Calcium: 9.7 mg/dL (ref 8.6–10.3)
Chloride: 105 mmol/L (ref 98–110)
Creat: 0.96 mg/dL (ref 0.70–1.22)
Glucose, Bld: 83 mg/dL (ref 65–99)
Potassium: 4.9 mmol/L (ref 3.5–5.3)
Sodium: 142 mmol/L (ref 135–146)
eGFR: 79 mL/min/{1.73_m2} (ref >=60)

## 2022-08-16 LAB — LIPID PANEL
Cholesterol: 205 mg/dL — ABNORMAL HIGH (ref <200)
HDL: 53 mg/dL (ref >=40)
LDL Cholesterol (Calc): 124 mg/dL (calc) — ABNORMAL HIGH
Non-HDL Cholesterol (Calc): 152 mg/dL (calc) — ABNORMAL HIGH (ref <130)
Total CHOL/HDL Ratio: 3.9 (calc) (ref <5.0)
Triglycerides: 160 mg/dL — ABNORMAL HIGH (ref <150)

## 2022-08-16 LAB — TSH: TSH: 1.72 mIU/L (ref 0.40–4.50)

## 2022-08-16 NOTE — Assessment & Plan Note (Signed)
Chronic problem, likely secondary to chronic prednisone high risk med and age No known fractures due to OP Failed Fosamax due to myalgia and complications Followed by Rheum, monitoring DEXA

## 2022-08-16 NOTE — Assessment & Plan Note (Signed)
Chronic problem over past several years since 2016-17 Followed by Dr Jefm Bryant at [facility] on prednisone, gradual lower dose Review records No longer having active symptoms, no headache or vision loss

## 2022-08-16 NOTE — Assessment & Plan Note (Signed)
Followed by [facility] Controlled BPH on finasteride Elevated PSA in setting of abnormal prostate biopsy with suspicious cells / PIN high grade, no confirmation of prostate cancer. Continues on Finasteride '5mg'$  F/u PSA trend w/ Urology

## 2022-08-31 ENCOUNTER — Telehealth: Payer: Self-pay | Admitting: Family Medicine

## 2022-08-31 NOTE — Telephone Encounter (Signed)
Contacted Jackson Coffey to schedule their annual wellness visit. Patient declined to schedule AWV at this time.  Jackson Coffey; Care Guide Ambulatory Clinical [facility] Direct Dial: [phone]

## 2022-10-24 DIAGNOSIS — Z961 Presence of intraocular lens: Secondary | ICD-10-CM | POA: Diagnosis not present

## 2022-10-24 DIAGNOSIS — M316 Other giant cell arteritis: Secondary | ICD-10-CM | POA: Diagnosis not present

## 2022-11-07 DIAGNOSIS — Z7952 Long term (current) use of systemic steroids: Secondary | ICD-10-CM | POA: Diagnosis not present

## 2022-11-07 DIAGNOSIS — M818 Other osteoporosis without current pathological fracture: Secondary | ICD-10-CM | POA: Diagnosis not present

## 2022-11-07 DIAGNOSIS — M316 Other giant cell arteritis: Secondary | ICD-10-CM | POA: Diagnosis not present

## 2022-12-11 NOTE — Progress Notes (Unsigned)
12/12/2022 8:23 AM   Jackson Coffey 1941/01/30 [id number]  Referring provider: Smitty Cords, DO [address] [place],  [place] [address]  Urological history: 1. Peyronie's disease -VED  2.  Nephrolithiasis -KUB (2015) -2 small calcification over the lower pole left kidney  3.  Elevated PSA -PSA (12/2021) 2.1  -prostate biopsy 2009 -high grade PIN and a single focus of abnormal " suspicious cells"  4.  BPH with LU TS -Finasteride 5 mg daily  5.  Erectile dysfunction -VED  6.  Incomplete bladder emptying -Finasteride 5 mg daily  Chief Complaint  Patient presents with   Follow-up   HPI: Jackson Coffey is a 82 y.o. male who presents today for yearly follow up.   I PSS 4/1  No urinary complaints.  Patient denies any modifying or aggravating factors.  Patient denies any gross hematuria, dysuria or suprapubic/flank pain.  Patient denies any fevers, chills, nausea or vomiting.     IPSS     Row Name 12/12/22 0800         International Prostate Symptom Score   How often have you had the sensation of not emptying your bladder? Not at All     How often have you had to urinate less than every two hours? Less than 1 in 5 times     How often have you found you stopped and started again several times when you urinated? Not at All     How often have you found it difficult to postpone urination? Not at All     How often have you had a weak urinary stream? Less than 1 in 5 times     How often have you had to strain to start urination? Less than 1 in 5 times     How many times did you typically get up at night to urinate? 1 Time     Total IPSS Score 4       Quality of Life due to urinary symptoms   If you were to spend the rest of your life with your urinary condition just the way it is now how would you feel about that? 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   Skin cancer 05/31/2017   side of nose   Wears hearing aid in both ears     Surgical History: Past Surgical History:  Procedure Laterality Date   ARTERY BIOPSY Left 02/18/2015   Procedure: BIOPSY TEMPORAL ARTERY;  Surgeon: Annice Needy, MD;  Location: [facility];  Service: Vascular;  Laterality: Left;   CATARACT EXTRACTION W/PHACO Left 03/13/2022   Procedure: CATARACT EXTRACTION PHACO AND INTRAOCULAR LENS PLACEMENT (IOC) LEFT MALYUGIN OMIDRIA;  Surgeon: Galen Manila, MD;  Location: [facility];  Service: Ophthalmology;  Laterality: Left;  13.87 1:11.1   CATARACT EXTRACTION W/PHACO Right 03/27/2022   Procedure: CATARACT EXTRACTION PHACO AND INTRAOCULAR LENS PLACEMENT (IOC) RIGHT OMIDRIA 9.38 00:47.8;  Surgeon: Galen Manila, MD;  Location: [facility];  Service: Ophthalmology;  Laterality: Right;   CYSTOSCOPY  Dr. Achilles Dunk, urologist   KIDNEY SURGERY     congential defect   MOHS SURGERY Left    left nasolabial fold   PROSTATE BIOPSY  2011, 2009   Dr. Achilles Dunk, urologist    Home Medications:  Allergies as of 12/12/2022       Reactions   Geocillin [carbenicillin] Swelling   Septra [sulfamethoxazole-trimethoprim] Swelling   Tetracyclines & Related Swelling        Medication List        Accurate as of December 12, 2022  8:23 AM. If you have any questions, ask your nurse or doctor.          alendronate 70 MG tablet Commonly known as: FOSAMAX Take 70 mg by mouth once a week.   aspirin 81 MG tablet Take 81 mg by mouth daily.   calcium carbonate 500 MG chewable tablet Commonly known as: TUMS - dosed in mg elemental calcium Chew 1 tablet by mouth daily.   finasteride 5 MG tablet Commonly known as:  PROSCAR Take 1 tablet (5 mg total) by mouth daily.   multivitamin tablet Take 1 tablet by mouth daily.   predniSONE 2 MG Tbec   PriLOSEC 20 MG capsule Generic drug: omeprazole Take 1 capsule (20 mg total) by mouth daily before breakfast.   vitamin C 100 MG tablet Take 100 mg by mouth daily.        Allergies:  Allergies  Allergen Reactions   Geocillin [Carbenicillin] Swelling   Septra [Sulfamethoxazole-Trimethoprim] Swelling   Tetracyclines & Related Swelling    Family History: Family History  Problem Relation Age of Onset   Parkinson's disease Mother 58   Heart attack Father 54   Heart disease Father    Thyroid disease Daughter    Stroke Maternal Grandmother    Heart disease Maternal Grandmother    Cancer Paternal Grandfather        unknown   Prostate cancer Paternal Grandfather     Social History:  reports that he quit smoking about 45 years ago. His smoking use included cigarettes. He smoked an average of 2 packs per day. He has never used smokeless tobacco. He reports that he does not currently use alcohol. He reports that he does not use drugs.  ROS: Pertinent ROS in HPI  Physical Exam: BP (!) 149/78   Pulse 94   Ht 5\' 10"  (1.778 m)   Wt 151 lb (68.5 kg)   BMI 21.67 kg/m   Constitutional:  Well nourished. Alert and oriented, No acute distress. HEENT: [place] AT, moist mucus membranes.  Trachea midline Cardiovascular: No clubbing, cyanosis, or edema. Respiratory: Normal respiratory effort, no increased work of breathing. Neurologic: Grossly intact, no focal deficits, moving all 4 extremities. Psychiatric: Normal mood and affect.   Laboratory Data: Lab Results  Component Value Date   CREATININE 0.96 08/15/2022   Hemoglobin A1c (07/2022) 5.5      Component Value Date/Time   CHOL 205 (H) 08/15/2022 0858   HDL 53 08/15/2022 0858   CHOLHDL 3.9 08/15/2022 0858   VLDL 27 12/28/2015 1046   LDLCALC 124 (H) 08/15/2022 0858  I have reviewed the  labs.   Pertinent Imaging: N/A  Assessment & Plan:    1. BPH with LUTS -Through SDM, we have decided to discontinue prostate cancer screening citing age -continue conservative management, avoiding bladder irritants and timed voiding's -Continue finasteride 5 mg daily    No follow-ups on file.  These notes generated with voice recognition software. I apologize for typographical  errors.  Michiel Cowboy, PA-C  [facility] [address]  [address] [place], [place] [address] [phone]

## 2022-12-12 ENCOUNTER — Encounter: Payer: Self-pay | Admitting: Urology

## 2022-12-12 ENCOUNTER — Ambulatory Visit: Payer: PPO | Admitting: Urology

## 2022-12-12 VITALS — BP 127/75 | HR 87 | Ht 70.0 in | Wt 151.0 lb

## 2022-12-12 DIAGNOSIS — N401 Enlarged prostate with lower urinary tract symptoms: Secondary | ICD-10-CM | POA: Diagnosis not present

## 2022-12-12 DIAGNOSIS — N400 Enlarged prostate without lower urinary tract symptoms: Secondary | ICD-10-CM

## 2022-12-12 MED ORDER — FINASTERIDE 5 MG PO TABS: 5.0000 mg | ORAL_TABLET | Freq: Every day | ORAL | 3 refills | Status: DC

## 2023-03-13 DIAGNOSIS — M316 Other giant cell arteritis: Secondary | ICD-10-CM | POA: Diagnosis not present

## 2023-03-13 DIAGNOSIS — L409 Psoriasis, unspecified: Secondary | ICD-10-CM | POA: Diagnosis not present

## 2023-03-13 DIAGNOSIS — Z7952 Long term (current) use of systemic steroids: Secondary | ICD-10-CM | POA: Diagnosis not present

## 2023-03-18 ENCOUNTER — Ambulatory Visit (INDEPENDENT_AMBULATORY_CARE_PROVIDER_SITE_OTHER): Payer: PPO

## 2023-03-18 DIAGNOSIS — Z23 Encounter for immunization: Secondary | ICD-10-CM

## 2023-05-09 ENCOUNTER — Encounter: Payer: Self-pay | Admitting: Family Medicine

## 2023-05-09 DIAGNOSIS — L409 Psoriasis, unspecified: Secondary | ICD-10-CM

## 2023-05-09 MED ORDER — DESONIDE 0.05 % EX CREA: TOPICAL_CREAM | Freq: Two times a day (BID) | CUTANEOUS | 2 refills | Status: AC | PRN

## 2023-07-24 DIAGNOSIS — Z7952 Long term (current) use of systemic steroids: Secondary | ICD-10-CM | POA: Diagnosis not present

## 2023-07-24 DIAGNOSIS — L400 Psoriasis vulgaris: Secondary | ICD-10-CM | POA: Diagnosis not present

## 2023-07-24 DIAGNOSIS — M316 Other giant cell arteritis: Secondary | ICD-10-CM | POA: Diagnosis not present

## 2023-08-28 ENCOUNTER — Encounter: Payer: PPO | Admitting: Family Medicine

## 2023-09-04 ENCOUNTER — Encounter: Payer: Self-pay | Admitting: Family Medicine

## 2023-09-04 ENCOUNTER — Ambulatory Visit (INDEPENDENT_AMBULATORY_CARE_PROVIDER_SITE_OTHER): Payer: PPO | Admitting: Family Medicine

## 2023-09-04 VITALS — BP 116/70 | HR 62 | Ht 70.0 in | Wt 151.0 lb

## 2023-09-04 DIAGNOSIS — Z23 Encounter for immunization: Secondary | ICD-10-CM | POA: Diagnosis not present

## 2023-09-04 DIAGNOSIS — Z0000 Encounter for general adult medical examination without abnormal findings: Secondary | ICD-10-CM

## 2023-09-04 DIAGNOSIS — N400 Enlarged prostate without lower urinary tract symptoms: Secondary | ICD-10-CM | POA: Diagnosis not present

## 2023-09-04 DIAGNOSIS — E7800 Pure hypercholesterolemia, unspecified: Secondary | ICD-10-CM | POA: Diagnosis not present

## 2023-09-04 DIAGNOSIS — I6523 Occlusion and stenosis of bilateral carotid arteries: Secondary | ICD-10-CM | POA: Diagnosis not present

## 2023-09-04 DIAGNOSIS — R972 Elevated prostate specific antigen [PSA]: Secondary | ICD-10-CM | POA: Diagnosis not present

## 2023-09-04 NOTE — Progress Notes (Signed)
 Subjective:    Patient ID: Jackson Coffey, male    DOB: Jan 27, 1941, 83 y.o.   MRN: [id number]  Jackson Coffey is a 83 y.o. male presenting on 09/04/2023 for Annual Exam   HPI  Discussed the use of AI scribe software for clinical note transcription with the patient, who gave verbal consent to proceed.  History of Present Illness   Jackson Coffey is an 83 year old male with psoriasis who presents with a flare-up of his condition.  Here for Annual Physical and Lab Orders   Specialist Rheumatology - Followed by Dr Gavin [facility]) Urology - Followed by [facility]   Psoriasis Plaque Severe worsening. Recent updates with worsening plaque psoriasis when his chronic prednisone dosing was reduced to 2 mg daily.  Followed by [facility] in [place] - on topical treatment, new topical treatment with improvement.  Unsure the name or type of treatment, they can send [place] the information if need re orders. Appears to have rash on lower extremity trunk and upper ext. Chronic issue >40 years this is worst flare - has Desonide and Triamcinolone as well. Using OTC emollients   Temporal Arteritis / Osteoporosis Followed by Rheumatology Now on reduced dose chronic prednisone 2mg  daily, Next apt 10/2023   GERD Omeprazole 20mg  daily OTC to prevent side effect from chronic prednisone.   BPH / High Grade PIN / Elevated PSA / History of Kidney Stones Followed by Urology No longer doing PSA surveillance - History of prior kidney stones Past few years, has had elevated PSA. Had prostate biopsy multiple times, has had some High Grade PIN and pre-cancerous cells also suspicious abnormal cells. Had improved PSA On Finasteride doing well, chronically Admits nocturia x 1 Prior kidney surgery age 2, had a congenital Left UPJ artery crossing issue, prior Dr Leonette Monarch   Elevated BP No known history of Hypertension. Has had occasional elevated reading. Often  improves on repeat. home readings well controlled, match BP reading here. Normally BP 115s / 60s Today higher BP reading   Carotid Atherosclerosis He is on ASA 81 Not on statin therapy Previously imaged, with history of Temporal arteritis   Former smoker Reports history of possible COPD. But he says this has not been confirmed and he has no problems with this. No prior imaging to support.    Health Maintenance: Due  Prevnar 20 today   History of Zostavax, but unsure about Shingrix      08/16/2021    8:36 AM 02/10/2020   10:30 AM 04/30/2018    8:50 AM  Depression screen PHQ 2/9  Decreased Interest 0 0 0  Down, Depressed, Hopeless 0 0 0  PHQ - 2 Score 0 0 0  Altered sleeping   0  Tired, decreased energy   0  Change in appetite   0  Feeling bad or failure about yourself    0  Trouble concentrating   0  Moving slowly or fidgety/restless   0  Suicidal thoughts   0  PHQ-9 Score   0  Difficult doing work/chores   Not difficult at all        No data to display           Past Medical History:  Diagnosis Date   Arthritis    Carotid atherosclerosis 12/28/2015   Do not resuscitate status 01/16/2017   Discussed with patient, has living will   Elevated PSA    Giant cell arteritis (HCC) 12/28/2015   Kidney  stones    PIN III (prostatic intraepithelial neoplasia III) 12/28/2015   Skin cancer 05/31/2017   side of nose   Wears hearing aid in both ears    Past Surgical History:  Procedure Laterality Date   ARTERY BIOPSY Left 02/18/2015   Procedure: BIOPSY TEMPORAL ARTERY;  Surgeon: Jackson Needy, MD;  Location: [facility];  Service: Vascular;  Laterality: Left;   CATARACT EXTRACTION W/PHACO Left 03/13/2022   Procedure: CATARACT EXTRACTION PHACO AND INTRAOCULAR LENS PLACEMENT (IOC) LEFT MALYUGIN OMIDRIA;  Surgeon: Jackson Manila, MD;  Location: [facility];  Service: Ophthalmology;  Laterality: Left;  13.87 1:11.1   CATARACT EXTRACTION W/PHACO Right 03/27/2022    Procedure: CATARACT EXTRACTION PHACO AND INTRAOCULAR LENS PLACEMENT (IOC) RIGHT OMIDRIA 9.38 00:47.8;  Surgeon: Jackson Manila, MD;  Location: [facility];  Service: Ophthalmology;  Laterality: Right;   CYSTOSCOPY     Dr. Achilles Coffey, urologist   KIDNEY SURGERY     congential defect   MOHS SURGERY Left    left nasolabial fold   PROSTATE BIOPSY  2011, 2009   Dr. Achilles Coffey, urologist   Social History   Socioeconomic History   Marital status: Married    Spouse name: Not on file   Number of children: Not on file   Years of education: Not on file   Highest education level: Not on file  Occupational History   Not on file  Tobacco Use   Smoking status: Former    Current packs/day: 0.00    Types: Cigarettes    Quit date: 02/16/1977    Years since quitting: 46.5   Smokeless tobacco: Never  Vaping Use   Vaping status: Never Used  Substance and Sexual Activity   Alcohol use: Not Currently   Drug use: Never   Sexual activity: Not Currently  Other Topics Concern   Not on file  Social History Narrative   Not on file   Social Drivers of Health   Financial Resource Strain: Not on file  Food Insecurity: Not on file  Transportation Needs: Not on file  Physical Activity: Not on file  Stress: Not on file  Social Connections: Not on file  Intimate Partner Violence: Not on file   Family History  Problem Relation Age of Onset   Parkinson's disease Mother 25   Heart attack Father 9   Heart disease Father    Thyroid disease Daughter    Stroke Maternal Grandmother    Heart disease Maternal Grandmother    Cancer Paternal Grandfather        unknown   Prostate cancer Paternal Grandfather    Current Outpatient Medications on File Prior to Visit  Medication Sig   Ascorbic Acid (VITAMIN C) 100 MG tablet Take 100 mg by mouth daily.   aspirin 81 MG tablet Take 81 mg by mouth daily.   calcium carbonate (TUMS - DOSED IN MG ELEMENTAL CALCIUM) 500 MG chewable tablet Chew 1 tablet by mouth  daily.   finasteride (PROSCAR) 5 MG tablet Take 1 tablet (5 mg total) by mouth daily.   Multiple Vitamin (MULTIVITAMIN) tablet Take 1 tablet by mouth daily.   predniSONE 2 MG TBEC    PRILOSEC 20 MG capsule Take 1 capsule (20 mg total) by mouth daily before breakfast.   triamcinolone ointment (KENALOG) 0.1 % Apply topically 2 (two) times daily.   alendronate (FOSAMAX) 70 MG tablet Take 70 mg by mouth once a week. (Patient not taking: Reported on 09/04/2023)   desonide (DESOWEN) 0.05 % cream  Apply topically 2 (two) times daily as needed (psoriasis). (Patient not taking: Reported on 09/04/2023)   No current facility-administered medications on file prior to visit.    Review of Systems  Constitutional:  Negative for activity change, appetite change, chills, diaphoresis, fatigue and fever.  HENT:  Negative for congestion and hearing loss.   Eyes:  Negative for visual disturbance.  Respiratory:  Negative for cough, chest tightness, shortness of breath and wheezing.   Cardiovascular:  Negative for chest pain, palpitations and leg swelling.  Gastrointestinal:  Negative for abdominal pain, constipation, diarrhea, nausea and vomiting.  Genitourinary:  Negative for dysuria, frequency and hematuria.  Musculoskeletal:  Negative for arthralgias and neck pain.  Skin:  Positive for rash.  Neurological:  Negative for dizziness, weakness, light-headedness, numbness and headaches.  Hematological:  Negative for adenopathy.  Psychiatric/Behavioral:  Negative for behavioral problems, dysphoric mood and sleep disturbance.    Per HPI unless specifically indicated above     Objective:    BP 116/70 (BP Location: Left Arm, Cuff Size: Normal)   Pulse 62   Ht 5\' 10"  (1.778 m)   Wt 151 lb (68.5 kg)   BMI 21.67 kg/m   Wt Readings from Last 3 Encounters:  09/04/23 151 lb (68.5 kg)  12/12/22 151 lb (68.5 kg)  08/15/22 146 lb 12.8 oz (66.6 kg)    Physical Exam Vitals and nursing note reviewed.   Constitutional:      General: He is not in acute distress.    Appearance: He is well-developed. He is not diaphoretic.     Comments: Well-appearing, comfortable, cooperative  HENT:     Head: Normocephalic and atraumatic.  Eyes:     General:        Right eye: No discharge.        Left eye: No discharge.     Conjunctiva/sclera: Conjunctivae normal.     Pupils: Pupils are equal, round, and reactive to light.  Neck:     Thyroid: No thyromegaly.  Cardiovascular:     Rate and Rhythm: Normal rate and regular rhythm.     Pulses: Normal pulses.     Heart sounds: Normal heart sounds. No murmur heard. Pulmonary:     Effort: Pulmonary effort is normal. No respiratory distress.     Breath sounds: Normal breath sounds. No wheezing or rales.  Abdominal:     General: Bowel sounds are normal. There is no distension.     Palpations: Abdomen is soft. There is no mass.     Tenderness: There is no abdominal tenderness.  Musculoskeletal:        General: No tenderness. Normal range of motion.     Cervical back: Normal range of motion and neck supple.     Comments: Upper / Lower Extremities: - Normal muscle tone, strength bilateral upper extremities 5/5, lower extremities 5/5  Lymphadenopathy:     Cervical: No cervical adenopathy.  Skin:    General: Skin is warm and dry.     Findings: Rash (extensive psoriasis plaques lower extremities) present. No erythema.  Neurological:     Mental Status: He is alert and oriented to person, place, and time.     Comments: Distal sensation intact to light touch all extremities  Psychiatric:        Mood and Affect: Mood normal.        Behavior: Behavior normal.        Thought Content: Thought content normal.     Comments: Well groomed, good eye contact,  normal speech and thoughts     Results for orders placed or performed in visit on 08/15/22  Lipid panel   Collection Time: 08/15/22  8:58 AM  Result Value Ref Range   Cholesterol 205 (H) <200 mg/dL   HDL  53 > OR = 40 mg/dL   Triglycerides 409 (H) <150 mg/dL   LDL Cholesterol (Calc) 124 (H) mg/dL (calc)   Total CHOL/HDL Ratio 3.9 <5.0 (calc)   Non-HDL Cholesterol (Calc) 152 (H) <130 mg/dL (calc)  TSH   Collection Time: 08/15/22  8:58 AM  Result Value Ref Range   TSH 1.72 0.40 - 4.50 mIU/L  BASIC METABOLIC PANEL WITH GFR   Collection Time: 08/15/22  8:58 AM  Result Value Ref Range   Glucose, Bld 83 65 - 99 mg/dL   BUN 19 7 - 25 mg/dL   Creat 8.11 9.14 - 7.82 mg/dL   eGFR 79 > OR = 60 NF/AOZ/3.08M5   BUN/Creatinine Ratio SEE NOTE: 6 - 22 (calc)   Sodium 142 135 - 146 mmol/L   Potassium 4.9 3.5 - 5.3 mmol/L   Chloride 105 98 - 110 mmol/L   CO2 32 20 - 32 mmol/L   Calcium 9.7 8.6 - 10.3 mg/dL      Assessment & Plan:   Problem List Items Addressed This Visit     BPH with elevated PSA   Carotid atherosclerosis   Other Visit Diagnoses       Annual physical exam    -  Primary   Relevant Orders   Lipid panel   TSH   BASIC METABOLIC PANEL WITH GFR     Need for Streptococcus pneumoniae vaccination       Relevant Orders   Pneumococcal conjugate vaccine 20-valent (Completed)     Pure hypercholesterolemia       Relevant Orders   Lipid panel   TSH   BASIC METABOLIC PANEL WITH GFR        Updated Health Maintenance information Fasting labs ordered for today, reviewed outside labs 07/2023 as well Encouraged improvement to lifestyle with diet and exercise Goal of weight loss   Chronic Psoriasis, plaques Worsening psoriasis despite topical treatment. Patient is currently on 2mg  Prednisone daily for gout cell arthritis, which may have been helping to control psoriasis symptoms at higher dose previously, now lowered prednisone seems psoriasis skin rash flared up and severe worsening.  Patient is considering increasing Prednisone dose after discussion with rheumatologist in April. -Continue current topical treatment and consider increasing application as tolerated. (Unsure exactly  which type of topical they are using, they do not have the name today, when ready for more refills - asked them to share this info with [place] and we can order it If possible) - Alternatively, consider referral to other site for Dermatology if need further treatment options. Note currently at [facility] (do not have records from their office)  Hypertension Elevated blood pressure readings in office, but patient reports normal readings at home. No current antihypertensive treatment. Asymptomatic  -Monitor blood pressure at home weekly. -Consider bringing home blood pressure cuff to next appointment for calibration. -Consider antihypertensive treatment if high readings persist.  General Health Maintenance -Administer Pneumonia vaccine today. Prevnar-20 -Order cholesterol, thyroid, and chemistry blood tests. -Schedule follow-up appointment in 6 months or sooner if blood pressure issues arise.         Orders Placed This Encounter  Procedures   Pneumococcal conjugate vaccine 20-valent   Lipid panel    Has the patient  fasted?:   Yes   TSH   BASIC METABOLIC PANEL WITH GFR    No orders of the defined types were placed in this encounter.    Follow up plan: Return in about 1 year (around 09/03/2024) for 1 year Annual Physical lab AFTER.  Saralyn Pilar, DO [facility] 09/04/2023, 8:10 AM

## 2023-09-04 NOTE — Patient Instructions (Addendum)
 Thank you for coming to the office today.  Labs today  Prevnar 20 pneumonia vaccine here, good for several years.  Let me know all of the details on the topical medication and I can re order it.  Check BP regularly if higher reading >135/85 please notify us and follow-up sooner   Please schedule a Follow-up Appointment to: Return in about 1 year (around 09/03/2024) for 1 year Annual Physical lab AFTER.  If you have any other questions or concerns, please feel free to call the office or send a message through MyChart. You may also schedule an earlier appointment if necessary.  Additionally, you may be receiving a survey about your experience at our office within a few days to 1 week by e-mail or mail. We value your feedback.  Saralyn Pilar, DO [facility], [place]

## 2023-09-05 ENCOUNTER — Encounter: Payer: Self-pay | Admitting: Family Medicine

## 2023-09-05 LAB — BASIC METABOLIC PANEL WITH GFR
BUN: 17 mg/dL (ref 7–25)
CO2: 30 mmol/L (ref 20–32)
Calcium: 9.4 mg/dL (ref 8.6–10.3)
Chloride: 106 mmol/L (ref 98–110)
Creat: 0.94 mg/dL (ref 0.70–1.22)
Glucose, Bld: 82 mg/dL (ref 65–99)
Potassium: 3.9 mmol/L (ref 3.5–5.3)
Sodium: 142 mmol/L (ref 135–146)
eGFR: 81 mL/min/{1.73_m2} (ref >=60)

## 2023-09-05 LAB — LIPID PANEL
Cholesterol: 194 mg/dL (ref <200)
HDL: 50 mg/dL (ref >=40)
LDL Cholesterol (Calc): 117 mg/dL — ABNORMAL HIGH
Non-HDL Cholesterol (Calc): 144 mg/dL — ABNORMAL HIGH (ref <130)
Total CHOL/HDL Ratio: 3.9 (calc) (ref <5.0)
Triglycerides: 154 mg/dL — ABNORMAL HIGH (ref <150)

## 2023-09-05 LAB — TSH: TSH: 2.17 m[IU]/L (ref 0.40–4.50)

## 2023-09-23 DIAGNOSIS — L400 Psoriasis vulgaris: Secondary | ICD-10-CM | POA: Diagnosis not present

## 2023-10-23 DIAGNOSIS — L400 Psoriasis vulgaris: Secondary | ICD-10-CM | POA: Diagnosis not present

## 2023-10-23 DIAGNOSIS — M316 Other giant cell arteritis: Secondary | ICD-10-CM | POA: Diagnosis not present

## 2023-10-23 DIAGNOSIS — Z7952 Long term (current) use of systemic steroids: Secondary | ICD-10-CM | POA: Diagnosis not present

## 2023-10-23 DIAGNOSIS — L4050 Arthropathic psoriasis, unspecified: Secondary | ICD-10-CM | POA: Diagnosis not present

## 2023-10-28 DIAGNOSIS — Z961 Presence of intraocular lens: Secondary | ICD-10-CM | POA: Diagnosis not present

## 2023-10-30 DIAGNOSIS — D225 Melanocytic nevi of trunk: Secondary | ICD-10-CM | POA: Diagnosis not present

## 2023-10-30 DIAGNOSIS — D2261 Melanocytic nevi of right upper limb, including shoulder: Secondary | ICD-10-CM | POA: Diagnosis not present

## 2023-10-30 DIAGNOSIS — D2262 Melanocytic nevi of left upper limb, including shoulder: Secondary | ICD-10-CM | POA: Diagnosis not present

## 2023-10-30 DIAGNOSIS — L430 Hypertrophic lichen planus: Secondary | ICD-10-CM | POA: Diagnosis not present

## 2023-10-30 DIAGNOSIS — L821 Other seborrheic keratosis: Secondary | ICD-10-CM | POA: Diagnosis not present

## 2023-10-30 DIAGNOSIS — L308 Other specified dermatitis: Secondary | ICD-10-CM | POA: Diagnosis not present

## 2023-10-30 DIAGNOSIS — D2272 Melanocytic nevi of left lower limb, including hip: Secondary | ICD-10-CM | POA: Diagnosis not present

## 2023-10-30 DIAGNOSIS — D2271 Melanocytic nevi of right lower limb, including hip: Secondary | ICD-10-CM | POA: Diagnosis not present

## 2023-10-30 DIAGNOSIS — L309 Dermatitis, unspecified: Secondary | ICD-10-CM | POA: Diagnosis not present

## 2023-11-07 ENCOUNTER — Other Ambulatory Visit: Payer: Self-pay | Admitting: Family Medicine

## 2023-11-07 ENCOUNTER — Other Ambulatory Visit

## 2023-11-07 ENCOUNTER — Telehealth: Payer: Self-pay

## 2023-11-07 DIAGNOSIS — Z111 Encounter for screening for respiratory tuberculosis: Secondary | ICD-10-CM

## 2023-11-07 DIAGNOSIS — Z1159 Encounter for screening for other viral diseases: Secondary | ICD-10-CM

## 2023-11-07 DIAGNOSIS — Z79899 Other long term (current) drug therapy: Secondary | ICD-10-CM

## 2023-11-07 NOTE — Telephone Encounter (Signed)
 Labs today Hep B panel, Hep C, CMET CBC TB Quantiferon Gold pending, per Dermatology for likely evaluation of immune modulating therapy.  Domingo Friend, DO [facility] 11/07/2023, 1:20 PM

## 2023-11-07 NOTE — Telephone Encounter (Signed)
 Need to fax labs results to [facility] when are back. Phone [phone] fax is [phone]     Lab results faxed

## 2023-11-08 DIAGNOSIS — Z111 Encounter for screening for respiratory tuberculosis: Secondary | ICD-10-CM | POA: Diagnosis not present

## 2023-11-08 DIAGNOSIS — Z79899 Other long term (current) drug therapy: Secondary | ICD-10-CM | POA: Diagnosis not present

## 2023-11-08 DIAGNOSIS — Z1159 Encounter for screening for other viral diseases: Secondary | ICD-10-CM | POA: Diagnosis not present

## 2023-11-11 ENCOUNTER — Telehealth: Payer: Self-pay

## 2023-11-11 LAB — COMPREHENSIVE METABOLIC PANEL WITH GFR
AG Ratio: 1.9 (calc) (ref 1.0–2.5)
ALT: 14 U/L (ref 9–46)
AST: 21 U/L (ref 10–35)
Albumin: 4.1 g/dL (ref 3.6–5.1)
Alkaline phosphatase (APISO): 90 U/L (ref 35–144)
BUN: 18 mg/dL (ref 7–25)
CO2: 28 mmol/L (ref 20–32)
Calcium: 9.7 mg/dL (ref 8.6–10.3)
Chloride: 104 mmol/L (ref 98–110)
Creat: 1.03 mg/dL (ref 0.70–1.22)
Globulin: 2.2 g/dL (ref 1.9–3.7)
Glucose, Bld: 119 mg/dL — ABNORMAL HIGH (ref 65–99)
Potassium: 4.6 mmol/L (ref 3.5–5.3)
Sodium: 140 mmol/L (ref 135–146)
Total Bilirubin: 0.5 mg/dL (ref 0.2–1.2)
Total Protein: 6.3 g/dL (ref 6.1–8.1)
eGFR: 73 mL/min/{1.73_m2} (ref >=60)

## 2023-11-11 LAB — QUANTIFERON-TB GOLD PLUS
Mitogen-NIL: 7.26 [IU]/mL
NIL: 0.03 [IU]/mL
QuantiFERON-TB Gold Plus: NEGATIVE
TB1-NIL: 0 [IU]/mL
TB2-NIL: 0 [IU]/mL

## 2023-11-11 LAB — CBC WITH DIFFERENTIAL/PLATELET
Absolute Lymphocytes: 724 {cells}/uL — ABNORMAL LOW (ref 850–3900)
Absolute Monocytes: 429 {cells}/uL (ref 200–950)
Basophils Absolute: 20 {cells}/uL (ref 0–200)
Basophils Relative: 0.3 %
Eosinophils Absolute: 147 {cells}/uL (ref 15–500)
Eosinophils Relative: 2.2 %
HCT: 38.4 % — ABNORMAL LOW (ref 38.5–50.0)
Hemoglobin: 13 g/dL — ABNORMAL LOW (ref 13.2–17.1)
MCH: 32.7 pg (ref 27.0–33.0)
MCHC: 33.9 g/dL (ref 32.0–36.0)
MCV: 96.7 fL (ref 80.0–100.0)
MPV: 10.9 fL (ref 7.5–12.5)
Monocytes Relative: 6.4 %
Neutro Abs: 5380 {cells}/uL (ref 1500–7800)
Neutrophils Relative %: 80.3 %
Platelets: 253 10*3/uL (ref 140–400)
RBC: 3.97 10*6/uL — ABNORMAL LOW (ref 4.20–5.80)
RDW: 11.6 % (ref 11.0–15.0)
Total Lymphocyte: 10.8 %
WBC: 6.7 10*3/uL (ref 3.8–10.8)

## 2023-11-11 LAB — HEPATITIS C ANTIBODY: Hepatitis C Ab: NONREACTIVE

## 2023-11-11 LAB — HEPATITIS B CORE ANTIBODY, TOTAL: Hep B Core Total Ab: NONREACTIVE

## 2023-11-11 LAB — HEPATITIS B SURFACE ANTIBODY,QUALITATIVE: Hep B S Ab: NONREACTIVE

## 2023-11-11 LAB — HEPATITIS B SURFACE ANTIGEN: Hepatitis B Surface Ag: NONREACTIVE

## 2023-11-11 NOTE — Telephone Encounter (Signed)
 Copied from CRM [id number]. Topic: Clinical - Lab/Test Results >> Nov 11, 2023  3:13 PM Tiffany S wrote: Reason for CRM: Please send labs to dermatologist office  Dr Arin Isestein [facility]  [phone]

## 2023-12-12 ENCOUNTER — Ambulatory Visit: Payer: Self-pay | Admitting: Urology

## 2023-12-12 ENCOUNTER — Encounter: Payer: Self-pay | Admitting: Urology

## 2023-12-12 DIAGNOSIS — N400 Enlarged prostate without lower urinary tract symptoms: Secondary | ICD-10-CM | POA: Diagnosis not present

## 2023-12-12 MED ORDER — FINASTERIDE 5 MG PO TABS
5.0000 mg | ORAL_TABLET | Freq: Every day | ORAL | 3 refills | Status: AC
Start: 2023-12-12 — End: ?

## 2023-12-12 NOTE — Progress Notes (Signed)
 12/12/2023 9:00 AM   Jackson Coffey 1940/09/26 [id number]  Referring provider: Raina Bunting, DO [address] [place],  [place] [address]  Urological history: 1. Peyronie's disease -VED  2.  Nephrolithiasis -KUB (2015) -2 small calcification over the lower pole left kidney  3.  Elevated PSA -PSA (12/2021) 2.1  -prostate biopsy 2009 -high grade PIN and a single focus of abnormal " suspicious cells"  4.  BPH with LU TS -Finasteride  5 mg daily  5.  Erectile dysfunction -VED  6.  Incomplete bladder emptying -Finasteride  5 mg daily  Chief Complaint  Patient presents with   Follow-up   HPI: Jackson Coffey is a 83 y.o. male who presents today for yearly follow up.   Previous records reviewed.     I PSS 5/1    IPSS     Row Name 12/12/23 0800         International Prostate Symptom Score   How often have you had the sensation of not emptying your bladder? Less than 1 in 5     How often have you had to urinate less than every two hours? Less than 1 in 5 times     How often have you found you stopped and started again several times when you urinated? Less than 1 in 5 times     How often have you found it difficult to postpone urination? Not at All     How often have you had a weak urinary stream? Less than 1 in 5 times     How often have you had to strain to start urination? Not at All     How many times did you typically get up at night to urinate? 1 Time     Total IPSS Score 5       Quality of Life due to urinary symptoms   If you were to spend the rest of your life with your urinary condition just the way it is now how would you feel about that? Pleased             Score:  1-7 Mild 8-19 Moderate 20-35 Severe     PMH: Past Medical History:  Diagnosis Date   Arthritis    Carotid atherosclerosis 12/28/2015   Do not resuscitate status 01/16/2017   Discussed with patient, has living will   Elevated PSA    Giant cell arteritis (HCC)  12/28/2015   Kidney stones    PIN III (prostatic intraepithelial neoplasia III) 12/28/2015   Skin cancer 05/31/2017   side of nose   Wears hearing aid in both ears     Surgical History: Past Surgical History:  Procedure Laterality Date   ARTERY BIOPSY Left 02/18/2015   Procedure: BIOPSY TEMPORAL ARTERY;  Surgeon: Celso College, MD;  Location: [facility];  Service: Vascular;  Laterality: Left;   CATARACT EXTRACTION W/PHACO Left 03/13/2022   Procedure: CATARACT EXTRACTION PHACO AND INTRAOCULAR LENS PLACEMENT (IOC) LEFT MALYUGIN OMIDRIA ;  Surgeon: Clair Crews, MD;  Location: [facility];  Service: Ophthalmology;  Laterality: Left;  13.87 1:11.1   CATARACT EXTRACTION W/PHACO Right 03/27/2022   Procedure: CATARACT EXTRACTION PHACO AND INTRAOCULAR LENS PLACEMENT (IOC) RIGHT OMIDRIA  9.38 00:47.8;  Surgeon: Clair Crews, MD;  Location: [facility];  Service: Ophthalmology;  Laterality: Right;   CYSTOSCOPY     Dr. Aram Knights, urologist   KIDNEY SURGERY     congential defect   MOHS SURGERY Left    left nasolabial  fold   PROSTATE BIOPSY  2011, 2009   Dr. Aram Knights, urologist    Home Medications:  Allergies as of 12/12/2023       Reactions   Geocillin [carbenicillin] Swelling   Septra [sulfamethoxazole-trimethoprim] Swelling   Tetracyclines & Related Swelling        Medication List        Accurate as of December 12, 2023  9:00 AM. If you have any questions, ask your nurse or doctor.          aspirin 81 MG tablet Take 81 mg by mouth daily.   calcium carbonate 500 MG chewable tablet Commonly known as: TUMS - dosed in mg elemental calcium Chew 1 tablet by mouth daily.   desonide  0.05 % cream Commonly known as: DesOwen  Apply topically 2 (two) times daily as needed (psoriasis).   finasteride  5 MG tablet Commonly known as: PROSCAR  Take 1 tablet (5 mg total) by mouth daily.   multivitamin tablet Take 1 tablet by mouth daily.   predniSONE 2 MG Tbec   PriLOSEC  20 MG  capsule Generic drug: omeprazole  Take 1 capsule (20 mg total) by mouth daily before breakfast.   triamcinolone ointment 0.1 % Commonly known as: KENALOG Apply topically 2 (two) times daily.   vitamin C 100 MG tablet Take 100 mg by mouth daily.        Allergies:  Allergies  Allergen Reactions   Geocillin [Carbenicillin] Swelling   Septra [Sulfamethoxazole-Trimethoprim] Swelling   Tetracyclines & Related Swelling    Family History: Family History  Problem Relation Age of Onset   Parkinson's disease Mother 71   Heart attack Father 72   Heart disease Father    Thyroid  disease Daughter    Stroke Maternal Grandmother    Heart disease Maternal Grandmother    Cancer Paternal Grandfather        unknown   Prostate cancer Paternal Grandfather     Social History:  reports that he quit smoking about 46 years ago. His smoking use included cigarettes. He has never used smokeless tobacco. He reports that he does not currently use alcohol. He reports that he does not use drugs.  ROS: Pertinent ROS in HPI  Physical Exam: BP (!) 147/75   Pulse 73   Ht 5\' 10"  (1.778 m)   Wt 144 lb (65.3 kg)   BMI 20.66 kg/m   Constitutional:  Well nourished. Alert and oriented, No acute distress. HEENT: Jackson Coffey AT, moist mucus membranes.  Trachea midline Cardiovascular: No clubbing, cyanosis, or edema. Respiratory: Normal respiratory effort, no increased work of breathing. Neurologic: Grossly intact, no focal deficits, moving all 4 extremities. Psychiatric: Normal mood and affect.   Laboratory Data: Lab Results  Component Value Date   CREATININE 1.03 11/08/2023      Component Value Date/Time   CHOL 194 09/04/2023 0931   HDL 50 09/04/2023 0931   CHOLHDL 3.9 09/04/2023 0931   VLDL 27 12/28/2015 1046   LDLCALC 117 (H) 09/04/2023 0931  I have reviewed the labs.   Pertinent Imaging: N/A  Assessment & Plan:    1. BPH with LUTS -he is still in agreement to not screen for prostate  cancer -continue conservative management, avoiding bladder irritants and timed voiding's -Continue finasteride  5 mg daily    Return in about 1 year (around 12/11/2024) for I PSS .  These notes generated with voice recognition software. I apologize for typographical errors.  Jackson Son, PA-C  [facility] [address]  [address]  [place], [place] [address] [phone]

## 2024-01-08 DIAGNOSIS — L2089 Other atopic dermatitis: Secondary | ICD-10-CM | POA: Diagnosis not present

## 2024-01-08 DIAGNOSIS — L430 Hypertrophic lichen planus: Secondary | ICD-10-CM | POA: Diagnosis not present

## 2024-01-09 ENCOUNTER — Other Ambulatory Visit: Payer: Self-pay | Admitting: Family Medicine

## 2024-01-09 DIAGNOSIS — M316 Other giant cell arteritis: Secondary | ICD-10-CM | POA: Diagnosis not present

## 2024-01-09 DIAGNOSIS — I6523 Occlusion and stenosis of bilateral carotid arteries: Secondary | ICD-10-CM

## 2024-01-09 LAB — CBC WITH DIFFERENTIAL/PLATELET
Absolute Lymphocytes: 902 {cells}/uL (ref 850–3900)
Absolute Monocytes: 533 {cells}/uL (ref 200–950)
Basophils Absolute: 21 {cells}/uL (ref 0–200)
Basophils Relative: 0.3 %
Eosinophils Absolute: 192 {cells}/uL (ref 15–500)
Eosinophils Relative: 2.7 %
HCT: 39.6 % (ref 38.5–50.0)
Hemoglobin: 13 g/dL — ABNORMAL LOW (ref 13.2–17.1)
MCH: 33 pg (ref 27.0–33.0)
MCHC: 32.8 g/dL (ref 32.0–36.0)
MCV: 100.5 fL — ABNORMAL HIGH (ref 80.0–100.0)
MPV: 10.7 fL (ref 7.5–12.5)
Monocytes Relative: 7.5 %
Neutro Abs: 5453 {cells}/uL (ref 1500–7800)
Neutrophils Relative %: 76.8 %
Platelets: 258 10*3/uL (ref 140–400)
RBC: 3.94 10*6/uL — ABNORMAL LOW (ref 4.20–5.80)
RDW: 12.7 % (ref 11.0–15.0)
Total Lymphocyte: 12.7 %
WBC: 7.1 10*3/uL (ref 3.8–10.8)

## 2024-01-09 LAB — COMPREHENSIVE METABOLIC PANEL WITH GFR
AG Ratio: 2 (calc) (ref 1.0–2.5)
ALT: 14 U/L (ref 9–46)
AST: 20 U/L (ref 10–35)
Albumin: 4.1 g/dL (ref 3.6–5.1)
Alkaline phosphatase (APISO): 79 U/L (ref 35–144)
BUN: 15 mg/dL (ref 7–25)
CO2: 30 mmol/L (ref 20–32)
Calcium: 9.4 mg/dL (ref 8.6–10.3)
Chloride: 103 mmol/L (ref 98–110)
Creat: 0.96 mg/dL (ref 0.70–1.22)
Globulin: 2.1 g/dL (ref 1.9–3.7)
Glucose, Bld: 78 mg/dL (ref 65–139)
Potassium: 4.7 mmol/L (ref 3.5–5.3)
Sodium: 140 mmol/L (ref 135–146)
Total Bilirubin: 0.7 mg/dL (ref 0.2–1.2)
Total Protein: 6.2 g/dL (ref 6.1–8.1)
eGFR: 78 mL/min/{1.73_m2} (ref >=60)

## 2024-01-09 NOTE — Progress Notes (Signed)
 Ordered [facility] for patient.   Fax results to [phone]  Marsa Officer, DO [facility] 01/09/2024, 8:44 AM

## 2024-01-17 ENCOUNTER — Ambulatory Visit: Payer: Self-pay | Admitting: Family Medicine

## 2024-03-04 DIAGNOSIS — L4050 Arthropathic psoriasis, unspecified: Secondary | ICD-10-CM | POA: Diagnosis not present

## 2024-03-04 DIAGNOSIS — M316 Other giant cell arteritis: Secondary | ICD-10-CM | POA: Diagnosis not present

## 2024-03-04 DIAGNOSIS — Z7952 Long term (current) use of systemic steroids: Secondary | ICD-10-CM | POA: Diagnosis not present

## 2024-03-04 DIAGNOSIS — Z79899 Other long term (current) drug therapy: Secondary | ICD-10-CM | POA: Diagnosis not present

## 2024-03-04 DIAGNOSIS — L400 Psoriasis vulgaris: Secondary | ICD-10-CM | POA: Diagnosis not present

## 2024-03-11 ENCOUNTER — Ambulatory Visit (INDEPENDENT_AMBULATORY_CARE_PROVIDER_SITE_OTHER)

## 2024-03-11 DIAGNOSIS — Z23 Encounter for immunization: Secondary | ICD-10-CM | POA: Diagnosis not present

## 2024-05-13 ENCOUNTER — Encounter: Payer: Self-pay | Admitting: Family Medicine

## 2024-05-13 DIAGNOSIS — Z79899 Other long term (current) drug therapy: Secondary | ICD-10-CM | POA: Diagnosis not present

## 2024-05-13 DIAGNOSIS — L2989 Other pruritus: Secondary | ICD-10-CM | POA: Diagnosis not present

## 2024-05-13 DIAGNOSIS — L430 Hypertrophic lichen planus: Secondary | ICD-10-CM | POA: Diagnosis not present

## 2024-05-19 DIAGNOSIS — M316 Other giant cell arteritis: Secondary | ICD-10-CM | POA: Diagnosis not present

## 2024-05-20 DIAGNOSIS — Z7952 Long term (current) use of systemic steroids: Secondary | ICD-10-CM | POA: Diagnosis not present

## 2024-05-20 DIAGNOSIS — L400 Psoriasis vulgaris: Secondary | ICD-10-CM | POA: Diagnosis not present

## 2024-05-20 DIAGNOSIS — L4050 Arthropathic psoriasis, unspecified: Secondary | ICD-10-CM | POA: Diagnosis not present

## 2024-05-20 DIAGNOSIS — M316 Other giant cell arteritis: Secondary | ICD-10-CM | POA: Diagnosis not present

## 2024-05-21 NOTE — Addendum Note (Signed)
 Addended by: EDMAN MARSA PARAS on: 05/21/2024 02:17 PM   Modules accepted: Orders

## 2024-06-25 ENCOUNTER — Other Ambulatory Visit

## 2024-06-25 DIAGNOSIS — L430 Hypertrophic lichen planus: Secondary | ICD-10-CM

## 2024-06-26 ENCOUNTER — Ambulatory Visit: Payer: Self-pay | Admitting: Family Medicine

## 2024-06-26 LAB — CBC WITH DIFFERENTIAL/PLATELET
Absolute Lymphocytes: 353 {cells}/uL — ABNORMAL LOW (ref 850–3900)
Absolute Monocytes: 262 {cells}/uL (ref 200–950)
Basophils Absolute: 11 {cells}/uL (ref 0–200)
Basophils Relative: 0.1 %
Eosinophils Absolute: 80 {cells}/uL (ref 15–500)
Eosinophils Relative: 0.7 %
HCT: 38.7 % — ABNORMAL LOW (ref 39.4–51.1)
Hemoglobin: 12.9 g/dL — ABNORMAL LOW (ref 13.2–17.1)
MCH: 33.5 pg — ABNORMAL HIGH (ref 27.0–33.0)
MCHC: 33.3 g/dL (ref 31.6–35.4)
MCV: 100.5 fL (ref 81.4–101.7)
MPV: 10.7 fL (ref 7.5–12.5)
Monocytes Relative: 2.3 %
Neutro Abs: 10693 {cells}/uL — ABNORMAL HIGH (ref 1500–7800)
Neutrophils Relative %: 93.8 %
Platelets: 257 Thousand/uL (ref 140–400)
RBC: 3.85 Million/uL — ABNORMAL LOW (ref 4.20–5.80)
RDW: 12.8 % (ref 11.0–15.0)
Total Lymphocyte: 3.1 %
WBC: 11.4 Thousand/uL — ABNORMAL HIGH (ref 3.8–10.8)

## 2024-06-26 LAB — COMPREHENSIVE METABOLIC PANEL WITH GFR
AG Ratio: 1.8 (calc) (ref 1.0–2.5)
ALT: 20 U/L (ref 9–46)
AST: 20 U/L (ref 10–35)
Albumin: 3.7 g/dL (ref 3.6–5.1)
Alkaline phosphatase (APISO): 70 U/L (ref 35–144)
BUN: 17 mg/dL (ref 7–25)
CO2: 32 mmol/L (ref 20–32)
Calcium: 9.4 mg/dL (ref 8.6–10.3)
Chloride: 103 mmol/L (ref 98–110)
Creat: 1.03 mg/dL (ref 0.70–1.22)
Globulin: 2.1 g/dL (ref 1.9–3.7)
Glucose, Bld: 81 mg/dL (ref 65–99)
Potassium: 3.7 mmol/L (ref 3.5–5.3)
Sodium: 141 mmol/L (ref 135–146)
Total Bilirubin: 0.8 mg/dL (ref 0.2–1.2)
Total Protein: 5.8 g/dL — ABNORMAL LOW (ref 6.1–8.1)
eGFR: 72 mL/min/1.73m2

## 2024-07-06 NOTE — Progress Notes (Signed)
 Chief Complaint  Patient presents with   Temporal arteritis (CMS/HHS-HCC)    History of present illness:    Jackson Coffey is a 83 y.o.male who presents today for follow up of giant cell arteritis and comorbid psoriatic arthritis. He was last seen 07/07/2024 and was doing poorly with recurrence of GCA. We increased his prednisone dose to 40mg  daily and began gradually tapering down afterwards.  Jackson Coffey was diagnosed with giant cell arteritis in 2016 by Dr. G.W. Kernodle. Diagnosis was supported by positive temporal artery biopsy. He also has psoriatic arthritis diagnosed 2025, supported by inflammatory arthritis with psoriasis.  Jackson Coffey has tried the following rheumatologic medications in the past: - Methotrexate - current through dermatology - Prednisone - current - Rinvoq - current since 06/2024 for GCA  He is dong so-so. He notes he feels well other than dry mouth, increased thirst, and fatigue. He was experiencing significant insomnia but notes that has now resolved. He is taking 40mg  prednisone daily.    He denies temporal headaches, jaw claudication, and visual changes.    He has not had any other significant health changes or unexplained new symptoms since last visit.   Jackson Coffey's work status is retired from working in designer, fashion/clothing. He is a former smoker who smokes 2 ppd for 18 years, for a total of 36 pack years. He quit smoking at 83 years old. He drinks an average of 0 servings of alcohol weekly. Trevione reports a family history of RA in his mother and sister but denies any family history of known autoimmune disease otherwise.   Review of Systems All other ROS negative except as noted in HPI.  Patient Active Problem List   Diagnosis Date Noted   GCA (giant cell arteritis) (CMS/HHS-HCC)    Telangiectasia-chest 2014 01/28/2013   BPH (benign prostatic hyperplasia)    Varicose veins    Cyst of tonsil    Psoriasis    Presbycusis     Past Medical History:   Diagnosis Date   Barrel chest    BPH (benign prostatic hyperplasia)    Cyst of tonsil    benign left   GCA (giant cell arteritis) (CMS/HHS-HCC)    Kidney stones    Presbycusis    Psoriasis    Spontaneous pneumothorax    Varicose veins    Past Surgical History:  Procedure Laterality Date   BIOPSY PROSTATE NEEDLE/PUNCH     chest tube for pneumothorax     kidney surgery     Social History   Socioeconomic History   Marital status: Married  Occupational History   Occupation: Retired  Tobacco Use   Smoking status: Never   Smokeless tobacco: Never  Vaping Use   Vaping status: Never Used  Substance and Sexual Activity   Alcohol use: No   Drug use: No   Sexual activity: Defer  Social History Narrative   SH:   married to [place], prior 2 ppd smoker and quit years ago, no alcohol, retired   [employer]   Housing Stability: Unknown (07/06/2024)   Housing Stability Vital Sign    Homeless in the Last Year: No   Family History  Problem Relation Name Age of Onset   Parkinsonism Mother     Myocardial Infarction (Heart attack) Father      Physical Exam: BP (!) 182/73   Pulse 78   Ht 175.3 cm (5' 9)   Wt 64.5 kg (142 lb 3.2 oz)   BMI 21.00 kg/m   General:  Pleasant elderly white male sitting in chair. Well groomed, no acute distress. Non-toxic appearance.  HEENT: Conjunctivae normal. Temporal artery pulses palpable and symmetric. Muscles of mastication nontender to palpation. Temples nontender to palpation.  Cardiovascular: Normal rate and rhythm. No appreciable murmurs, rubs, or gallops. Radial artery pulses palpable and symmetric. Pulmonary: Normal effort of breathing. Symmetric chest expansion. No appreciable wheezing, rhonchi, stridor, or rales.  Musculoskeletal: Nontender to palpation throughout appendicular joint exam. No appreciable synovitis.Fist formation unimpaired bilaterally. Grip strength appropriate and equal.  Negative squeeze tests to hands and feet. No other tender, synovitic, warm, erythematous, or deformed joints. FROM all joints except as above.  Skin: Skin warm and dry.  Neurologic: Oriented to time, person, place, and situation.  Psychological: Normal behavior, thought content, and judgment. Records were reviewed Lab Results  Component Value Date/Time   SEDRATE 9 10/23/2023 11:12 AM   Lab Results  Component Value Date/Time   GLUCOSE 88 07/06/2024 09:13 AM   GLUCOSE 88 01/28/2013 10:04 AM   BUN 20 07/06/2024 09:13 AM   CREATININE 1.1 07/06/2024 09:13 AM   CREATININE 1.0 01/28/2013 10:04 AM   NA 141 07/06/2024 09:13 AM   NA 140 01/28/2013 10:04 AM   K 3.4 (L) 07/06/2024 09:13 AM   K 3.8 02/04/2013 08:38 AM   CL 104 07/06/2024 09:13 AM   CL 103 01/28/2013 10:04 AM   CO2 29.1 07/06/2024 09:13 AM   CALCIUM 9.2 07/06/2024 09:13 AM   CALCIUM 9.8 01/28/2013 10:04 AM   ALB 4.1 06/19/2024 08:38 AM   ALB 4.1 01/28/2013 10:04 AM   ALKPHOS 78 06/19/2024 08:38 AM   ALKPHOS 86 01/28/2013 10:04 AM   AST 22 06/19/2024 08:38 AM   AST 26 01/28/2013 10:04 AM   ALT 22 06/19/2024 08:38 AM   ALT 18 01/28/2013 10:04 AM   Lab Results  Component Value Date/Time   WBC 11.7 (H) 06/19/2024 08:38 AM   WBC 5.7 01/09/2012 11:04 AM   RBC 4.05 (L) 06/19/2024 08:38 AM   RBC 4.55 01/09/2012 11:04 AM   HGB 13.8 (L) 06/19/2024 08:38 AM   HGB 14.4 01/09/2012 11:04 AM   HCT 41.7 06/19/2024 08:38 AM   HCT 0.43 01/09/2012 11:04 AM   MCV 103.0 (H) 06/19/2024 08:38 AM   MCH 34.1 (H) 06/19/2024 08:38 AM   MCHC 33.1 06/19/2024 08:38 AM   PLT 264 06/19/2024 08:38 AM   PLT 252 01/09/2012 11:04 AM    Assessment and Plan: Temporal arteritis (CMS/HHS-HCC)  (primary encounter diagnosis) Plan: predniSONE (DELTASONE) 10 MG tablet, C-Reactive       Protein, Quant - [facility], Sedimentation        Rate-Westergren - [facility], CBC w/auto        Differential (5 Part), Hepatic Function Panel        (HFP), Creatinine,  C-Reactive Protein, Quant -        [facility], Sedimentation Rate-Westergren -        [facility], C-Reactive Protein, Quant - [facility],        Sedimentation Rate-Westergren - [facility]  Psoriatic arthritis (CMS/HHS-HCC)  Encounter for long-term (current) use of high-risk medication Plan: Hemoglobin A1C  Postinflammatory hyperpigmentation of lichen planus lesion  1. Giant cell arteritis - Symptomatically controlled, however we will recheck inflammatory marker trends prior to committing to a prednisone dose decrease to 30mg  daily. If labs allow, we will plan on q2 week tapering and lab checks, thanks to use of rinvoq. Continue rinvoq.   2. High risk prescription-This patient is on drug  therapy requiring intensive monitoring for toxicity, including regular lab monitoring and screening for serious or recurrent infections.  Today, I assessed for side effects, infections, new or worsening health conditions that may be related to the medications, and will obtain labs.  3. Psoriatic arthritis - Secondary concern today, fully treated at present by other medications for his GCA.   Questions were welcomed and answered.  Follow up with ELSIE CONCH DEFOOR, PA in about 6 weeks (around 08/26/2024).    Medication List      * Accurate as of July 15, 2024  9:41 AM. If you have any questions, ask your nurse or doctor.        CHANGE how you take these medications   predniSONE 10 MG tablet Commonly known as: DELTASONE Take 4 tablets (40 mg total) by mouth once daily for 30 days What changed: how much to take Changed by: ELSIE CONCH DEFOOR, PA     CONTINUE taking these medications   alendronate 70 MG tablet Commonly known as: FOSAMAX Take 1 tablet (70 mg total) by mouth every 7 (seven) days Take with a full glass of water. Do not lie down for the next 30 min.   aspirin  81 MG chewable tablet   betamethasone dipropionate (augmented) 0.05 % ointment Commonly known as: DIPROLENE Apply  topically 2 (two) times daily as needed.   calcium carbonate 500 mg calcium (1,250 mg) tablet   finasteride  5 mg tablet Commonly known as: PROSCAR    folic acid 1 MG tablet Commonly known as: FOLVITE   ketoconazole 2 % shampoo Commonly known as: NIZORAL 2 %   methotrexate 2.5 MG tablet Commonly known as: RHEUMATREX   omeprazole  20 MG DR capsule Commonly known as: PriLOSEC    UNABLE TO FIND 1 tab by mouth daily   upadacitinib 15 mg extended release tablet Commonly known as: RINVOQ Take 1 tablet (15 mg total) by mouth once daily       Where to Get Your Medications    These medications were sent to [facility] [address]), [place] - [address]  [address] [place]) [place] [address]   Phone: [phone]  predniSONE 10 MG tablet     Orders Placed This Encounter  Procedures   Hemoglobin A1C   C-Reactive Protein, Quant - [facility]   Sedimentation Rate-Westergren - [facility]   CBC w/auto Differential (5 Part)   Hepatic Function Panel (HFP)   Creatinine   C-Reactive Protein, Quant - [facility]   Sedimentation Rate-Westergren - [facility]   C-Reactive Protein, Quant - [facility]   Sedimentation Rate-Westergren - [facility]    All new prescription medications, changes in current prescription dosages, and sample medications were discussed with the patient, including patient education, medication name, use, dosage, potential side effects, drug interactions, consequences of not using/taking, and special instructions.  Patient expressed understanding.  No barriers to adherence. *Some images could not be shown.

## 2024-08-12 ENCOUNTER — Inpatient Hospital Stay
Admission: EM | Admit: 2024-08-12 | Source: Home / Self Care | Attending: Internal Medicine | Admitting: Internal Medicine

## 2024-08-12 ENCOUNTER — Other Ambulatory Visit: Payer: Self-pay

## 2024-08-12 ENCOUNTER — Emergency Department

## 2024-08-12 ENCOUNTER — Inpatient Hospital Stay

## 2024-08-12 DIAGNOSIS — B370 Candidal stomatitis: Secondary | ICD-10-CM | POA: Insufficient documentation

## 2024-08-12 DIAGNOSIS — S51011A Laceration without foreign body of right elbow, initial encounter: Secondary | ICD-10-CM

## 2024-08-12 DIAGNOSIS — N179 Acute kidney failure, unspecified: Secondary | ICD-10-CM | POA: Diagnosis not present

## 2024-08-12 DIAGNOSIS — N400 Enlarged prostate without lower urinary tract symptoms: Secondary | ICD-10-CM

## 2024-08-12 DIAGNOSIS — W19XXXA Unspecified fall, initial encounter: Secondary | ICD-10-CM

## 2024-08-12 DIAGNOSIS — A419 Sepsis, unspecified organism: Principal | ICD-10-CM | POA: Diagnosis present

## 2024-08-12 DIAGNOSIS — R6520 Severe sepsis without septic shock: Secondary | ICD-10-CM

## 2024-08-12 DIAGNOSIS — Z79899 Other long term (current) drug therapy: Secondary | ICD-10-CM

## 2024-08-12 DIAGNOSIS — L409 Psoriasis, unspecified: Secondary | ICD-10-CM

## 2024-08-12 DIAGNOSIS — B377 Candidal sepsis: Secondary | ICD-10-CM | POA: Insufficient documentation

## 2024-08-12 LAB — URINALYSIS, W/ REFLEX TO CULTURE (INFECTION SUSPECTED)
Bacteria, UA: NONE SEEN
Bilirubin Urine: NEGATIVE
Glucose, UA: NEGATIVE mg/dL
Ketones, ur: 5 mg/dL — AB
Leukocytes,Ua: NEGATIVE
Nitrite: NEGATIVE
Protein, ur: NEGATIVE mg/dL
Specific Gravity, Urine: 1.013 (ref 1.005–1.030)
Squamous Epithelial / HPF: 0 /HPF (ref 0–5)
pH: 6 (ref 5.0–8.0)

## 2024-08-12 LAB — RESP PANEL BY RT-PCR (RSV, FLU A&B, COVID)  RVPGX2
Influenza A by PCR: NEGATIVE
Influenza B by PCR: NEGATIVE
Resp Syncytial Virus by PCR: NEGATIVE
SARS Coronavirus 2 by RT PCR: NEGATIVE

## 2024-08-12 LAB — CBC WITH DIFFERENTIAL/PLATELET
Abs Immature Granulocytes: 0.06 10*3/uL (ref 0.00–0.07)
Basophils Absolute: 0 10*3/uL (ref 0.0–0.1)
Basophils Relative: 0 %
Eosinophils Absolute: 0.1 10*3/uL (ref 0.0–0.5)
Eosinophils Relative: 0 %
HCT: 32.3 % — ABNORMAL LOW (ref 39.0–52.0)
Hemoglobin: 11.2 g/dL — ABNORMAL LOW (ref 13.0–17.0)
Immature Granulocytes: 1 %
Lymphocytes Relative: 4 %
Lymphs Abs: 0.4 10*3/uL — ABNORMAL LOW (ref 0.7–4.0)
MCH: 33.6 pg (ref 26.0–34.0)
MCHC: 34.7 g/dL (ref 30.0–36.0)
MCV: 97 fL (ref 80.0–100.0)
Monocytes Absolute: 0.4 10*3/uL (ref 0.1–1.0)
Monocytes Relative: 3 %
Neutro Abs: 10.8 10*3/uL — ABNORMAL HIGH (ref 1.7–7.7)
Neutrophils Relative %: 92 %
Platelets: 200 10*3/uL (ref 150–400)
RBC: 3.33 MIL/uL — ABNORMAL LOW (ref 4.22–5.81)
RDW: 15.2 % (ref 11.5–15.5)
WBC: 11.7 10*3/uL — ABNORMAL HIGH (ref 4.0–10.5)
nRBC: 0 % (ref 0.0–0.2)

## 2024-08-12 LAB — COMPREHENSIVE METABOLIC PANEL WITH GFR
ALT: 36 U/L (ref 0–44)
AST: 43 U/L — ABNORMAL HIGH (ref 15–41)
Albumin: 3.6 g/dL (ref 3.5–5.0)
Alkaline Phosphatase: 59 U/L (ref 38–126)
Anion gap: 15 (ref 5–15)
BUN: 28 mg/dL — ABNORMAL HIGH (ref 8–23)
CO2: 24 mmol/L (ref 22–32)
Calcium: 9.7 mg/dL (ref 8.9–10.3)
Chloride: 99 mmol/L (ref 98–111)
Creatinine, Ser: 1.4 mg/dL — ABNORMAL HIGH (ref 0.61–1.24)
GFR, Estimated: 50 mL/min — ABNORMAL LOW
Glucose, Bld: 83 mg/dL (ref 70–99)
Potassium: 3.9 mmol/L (ref 3.5–5.1)
Sodium: 137 mmol/L (ref 135–145)
Total Bilirubin: 0.9 mg/dL (ref 0.0–1.2)
Total Protein: 5.9 g/dL — ABNORMAL LOW (ref 6.5–8.1)

## 2024-08-12 LAB — LACTIC ACID, PLASMA
Lactic Acid, Venous: 2.4 mmol/L (ref 0.5–1.9)
Lactic Acid, Venous: 3.9 mmol/L (ref 0.5–1.9)
Lactic Acid, Venous: 1.8 mmol/L (ref 0.5–1.9)

## 2024-08-12 MED ORDER — VANCOMYCIN HCL 1500 MG/300ML IV SOLN
1500.0000 mg | Freq: Once | INTRAVENOUS | Status: AC
  Administered 2024-08-12: 1500 mg via INTRAVENOUS
  Filled 2024-08-12: qty 300

## 2024-08-12 MED ORDER — SODIUM CHLORIDE 0.9 % IV SOLN: INTRAVENOUS | Status: AC

## 2024-08-12 MED ORDER — SODIUM CHLORIDE 0.9 % IV BOLUS
1000.0000 mL | Freq: Once | INTRAVENOUS | Status: AC
  Administered 2024-08-12: 1000 mL via INTRAVENOUS

## 2024-08-12 MED ORDER — HYDROCORTISONE SOD SUC (PF) 100 MG IJ SOLR
100.0000 mg | Freq: Two times a day (BID) | INTRAMUSCULAR | Status: AC
  Administered 2024-08-12 – 2024-08-14 (×5): 100 mg via INTRAVENOUS
  Filled 2024-08-12 (×7): qty 2

## 2024-08-12 MED ORDER — IBUPROFEN 600 MG PO TABS
600.0000 mg | ORAL_TABLET | Freq: Once | ORAL | Status: AC
  Administered 2024-08-12: 600 mg via ORAL
  Filled 2024-08-12: qty 1

## 2024-08-12 MED ORDER — SODIUM CHLORIDE 0.9 % IV SOLN
2.0000 g | Freq: Two times a day (BID) | INTRAVENOUS | Status: AC
  Administered 2024-08-13 – 2024-08-14 (×4): 2 g via INTRAVENOUS
  Filled 2024-08-12 (×5): qty 12.5

## 2024-08-12 MED ORDER — ACETAMINOPHEN 325 MG PO TABS
650.0000 mg | ORAL_TABLET | Freq: Once | ORAL | Status: AC
  Administered 2024-08-12: 650 mg via ORAL
  Filled 2024-08-12: qty 2

## 2024-08-12 MED ORDER — FLUCONAZOLE 100 MG PO TABS
100.0000 mg | ORAL_TABLET | Freq: Every day | ORAL | Status: DC
  Administered 2024-08-13: 100 mg via ORAL
  Filled 2024-08-12: qty 1

## 2024-08-12 MED ORDER — ONDANSETRON HCL 4 MG/2ML IJ SOLN: 4.0000 mg | Freq: Four times a day (QID) | INTRAMUSCULAR | Status: AC | PRN

## 2024-08-12 MED ORDER — TRIAMCINOLONE ACETONIDE 0.1 % EX CREA
TOPICAL_CREAM | Freq: Two times a day (BID) | CUTANEOUS | Status: AC
  Filled 2024-08-12: qty 15

## 2024-08-12 MED ORDER — ASPIRIN 81 MG PO TBEC
81.0000 mg | DELAYED_RELEASE_TABLET | Freq: Every day | ORAL | Status: AC
  Administered 2024-08-12 – 2024-08-14 (×3): 81 mg via ORAL
  Filled 2024-08-12 (×3): qty 1

## 2024-08-12 MED ORDER — ACETAMINOPHEN 650 MG RE SUPP: 650.0000 mg | Freq: Four times a day (QID) | RECTAL | Status: AC | PRN

## 2024-08-12 MED ORDER — SODIUM CHLORIDE 0.9 % IV BOLUS
500.0000 mL | Freq: Once | INTRAVENOUS | Status: AC
  Administered 2024-08-12: 500 mL via INTRAVENOUS

## 2024-08-12 MED ORDER — ONDANSETRON HCL 4 MG PO TABS: 4.0000 mg | ORAL_TABLET | Freq: Four times a day (QID) | ORAL | Status: AC | PRN

## 2024-08-12 MED ORDER — FLUCONAZOLE IN SODIUM CHLORIDE 200-0.9 MG/100ML-% IV SOLN
200.0000 mg | Freq: Once | INTRAVENOUS | Status: AC
  Administered 2024-08-12: 200 mg via INTRAVENOUS
  Filled 2024-08-12: qty 100

## 2024-08-12 MED ORDER — FINASTERIDE 5 MG PO TABS
5.0000 mg | ORAL_TABLET | Freq: Every day | ORAL | Status: AC
  Administered 2024-08-12 – 2024-08-14 (×3): 5 mg via ORAL
  Filled 2024-08-12 (×3): qty 1

## 2024-08-12 MED ORDER — TRIAMCINOLONE ACETONIDE 0.1 % EX OINT
1.0000 | TOPICAL_OINTMENT | Freq: Two times a day (BID) | CUTANEOUS | Status: DC
  Filled 2024-08-12 (×2): qty 15

## 2024-08-12 MED ORDER — SODIUM CHLORIDE 0.9 % IV SOLN
2.0000 g | Freq: Once | INTRAVENOUS | Status: AC
  Administered 2024-08-12: 2 g via INTRAVENOUS
  Filled 2024-08-12 (×2): qty 12.5

## 2024-08-12 MED ORDER — PANTOPRAZOLE SODIUM 40 MG PO TBEC
40.0000 mg | DELAYED_RELEASE_TABLET | Freq: Every day | ORAL | Status: AC
  Administered 2024-08-12 – 2024-08-14 (×3): 40 mg via ORAL
  Filled 2024-08-12 (×3): qty 1

## 2024-08-12 MED ORDER — HYDROCORTISONE 1 % EX CREA: 1.0000 | TOPICAL_CREAM | Freq: Four times a day (QID) | CUTANEOUS | Status: AC | PRN

## 2024-08-12 MED ORDER — ENOXAPARIN SODIUM 30 MG/0.3ML IJ SOSY
30.0000 mg | PREFILLED_SYRINGE | INTRAMUSCULAR | Status: DC
  Administered 2024-08-12 – 2024-08-13 (×2): 30 mg via SUBCUTANEOUS
  Filled 2024-08-12 (×2): qty 0.3

## 2024-08-12 MED ORDER — ACETAMINOPHEN 325 MG PO TABS
650.0000 mg | ORAL_TABLET | Freq: Four times a day (QID) | ORAL | Status: AC | PRN
  Administered 2024-08-14: 650 mg via ORAL
  Filled 2024-08-12: qty 2

## 2024-08-12 MED ORDER — SENNOSIDES-DOCUSATE SODIUM 8.6-50 MG PO TABS: 1.0000 | ORAL_TABLET | Freq: Every evening | ORAL | Status: AC | PRN

## 2024-08-12 MED ORDER — HYDROMORPHONE HCL 1 MG/ML IJ SOLN: 0.5000 mg | INTRAMUSCULAR | Status: AC | PRN

## 2024-08-12 NOTE — ED Notes (Signed)
 Called pt's daughter per their request to give her an update on the progress of pt's visit and that they will be admitted and staying at least overnight.

## 2024-08-12 NOTE — ED Provider Notes (Signed)
 ED ECG REPORT I, Waylon Cassis, the attending physician, personally viewed and interpreted this ECG.  Date: 08/12/2024 EKG Time: 0933 Rate: 120 Rhythm: Sinus tachycardia QRS Axis: normal Intervals: normal ST/T Wave abnormalities: normal Narrative Interpretation: no evidence of acute ischemia    Cassis Waylon, MD 08/12/24 [phone]

## 2024-08-12 NOTE — ED Triage Notes (Signed)
 C/O 2 day history of chills and this morning fell, slid to the floor, landed on bottom.  Denies hitting head. No LOC.  AAOx3. Skin warm and dry. NAD

## 2024-08-12 NOTE — H&P (Signed)
 " History and Physical    Jackson Coffey FMW:[id number] DOB: 06-18-1941 DOA: 08/12/2024  PCP: Jackson Jackson PARAS, DO (Confirm with patient/family/NH records and if not entered, this has to be entered at [facility] point of entry) Patient coming from: Home  I have personally briefly reviewed patient's old medical records in [facility]  Chief Complaint: Fever and chills  HPI: Jackson Coffey is Coffey 84 y.o. male with medical history significant of giant cell arteritis, rheumatoid arthritis, presented with worsening of cycles of fever and chills.  Symptoms started 3 days ago when patient started to have cycles of subjective fever and chills sweating.  He denied any cough, no nauseous vomiting abdominal pain or diarrhea no urinary symptoms.  Denied any new rashes other than the chronic psoriasis rashes.  Last 2 days has been feeling increasing generalized weakness, and this morning when trying to standing up he fell and hit his right elbow.  Denied any LOC.  He was diagnosed with GCA 10 years ago and has been on prednisone, his prednisone dosage was increased from 30 mg to 40 mg several months ago to address increased ESR.  Despite, recent blood work showed persistent high ESR and patient was started on Rinvoq about 2 weeks ago.  ED Course: Fever 102.4 pulse rate 100-1 tens, blood pressure 117/57 O2 saturation 85% on room air.  Chest x-ray negative for acute infiltrates.  UA negative for UTI, blood work showed WBC 11.7 with left shift BUN 28 creatinine 1.4 compared to baseline 1.0.  Patient was started on vancomycin  and cefepime  1000 mL IV bolus given.  Review of Systems: As per HPI otherwise 14 point review of systems negative.    Past Medical History:  Diagnosis Date   Arthritis    Carotid atherosclerosis 12/28/2015   Do not resuscitate status 01/16/2017   Discussed with patient, has living will   Elevated PSA    Giant cell arteritis (HCC) 12/28/2015   Kidney stones    PIN III  (prostatic intraepithelial neoplasia III) 12/28/2015   Skin cancer 05/31/2017   side of nose   Wears hearing aid in both ears     Past Surgical History:  Procedure Laterality Date   ARTERY BIOPSY Left 02/18/2015   Procedure: BIOPSY TEMPORAL ARTERY;  Surgeon: Jackson GORMAN Gu, MD;  Location: [facility] ORS;  Service: Vascular;  Laterality: Left;   CATARACT EXTRACTION W/PHACO Left 03/13/2022   Procedure: CATARACT EXTRACTION PHACO AND INTRAOCULAR LENS PLACEMENT (IOC) LEFT MALYUGIN OMIDRIA ;  Surgeon: Jackson Fallow, MD;  Location: [facility];  Service: Ophthalmology;  Laterality: Left;  13.87 1:11.1   CATARACT EXTRACTION W/PHACO Right 03/27/2022   Procedure: CATARACT EXTRACTION PHACO AND INTRAOCULAR LENS PLACEMENT (IOC) RIGHT OMIDRIA  9.38 00:47.8;  Surgeon: Jackson Fallow, MD;  Location: [facility];  Service: Ophthalmology;  Laterality: Right;   CYSTOSCOPY     Dr. Ike, urologist   KIDNEY SURGERY     congential defect   MOHS SURGERY Left    left nasolabial fold   PROSTATE BIOPSY  2011, 2009   Dr. Ike, urologist     reports that he quit smoking about 47 years ago. His smoking use included cigarettes. He smoked an average of 2 packs per day. He has never used smokeless tobacco. He reports that he does not currently use alcohol. He reports that he does not use drugs.  Allergies[1]  Family History  Problem Relation Age of Onset   Parkinson's disease Mother 42   Heart attack Father 33  Heart disease Father    Thyroid  disease Daughter    Stroke Maternal Grandmother    Heart disease Maternal Grandmother    Cancer Paternal Grandfather        unknown   Prostate cancer Paternal Grandfather      Prior to Admission medications  Medication Sig Start Date End Date Taking? Authorizing Provider  Ascorbic Acid (VITAMIN C) 100 MG tablet Take 100 mg by mouth daily.    [provider]  aspirin  81 MG tablet Take 81 mg by mouth daily.    [provider]  calcium  carbonate (TUMS - DOSED IN MG ELEMENTAL CALCIUM) 500 MG chewable tablet Chew 1 tablet by mouth daily.    [provider]  desonide  (DESOWEN ) 0.05 % cream Apply topically 2 (two) times daily as needed (psoriasis). 05/09/23   Karamalegos, Jackson PARAS, DO  finasteride  (PROSCAR ) 5 MG tablet Take 1 tablet (5 mg total) by mouth daily. 12/12/23   Jackson Kirsch A, PA-C  Multiple Vitamin (MULTIVITAMIN) tablet Take 1 tablet by mouth daily.    [provider]  predniSONE 2 MG TBEC  08/15/22   Karamalegos, Jackson PARAS, DO  PRILOSEC  20 MG capsule Take 1 capsule (20 mg total) by mouth daily before breakfast. 02/10/20   Jackson, Jackson PARAS, DO  triamcinolone  ointment (KENALOG ) 0.1 % Apply topically 2 (two) times daily. 07/24/23   [provider]    Physical Exam: Vitals:   08/12/24 0859 08/12/24 1053  BP: 115/80 (!) 117/57  Pulse: (!) 125 (!) 102  Resp: 16 17  Temp: (!) 100.8 F (38.2 C) (!) 102.4 F (39.1 C)  TempSrc: Oral Oral  SpO2: 95% 95%  Weight: 65.3 kg     Constitutional: NAD, calm, comfortable Vitals:   08/12/24 0859 08/12/24 1053  BP: 115/80 (!) 117/57  Pulse: (!) 125 (!) 102  Resp: 16 17  Temp: (!) 100.8 F (38.2 C) (!) 102.4 F (39.1 C)  TempSrc: Oral Oral  SpO2: 95% 95%  Weight: 65.3 kg    Eyes: PERRL, lids and conjunctivae normal ENMT: Mucous membranes are moist. Posterior pharynx covered with thrush Neck: normal, supple, no masses, no thyromegaly Respiratory: clear to auscultation bilaterally, no wheezing, no crackles. Normal respiratory effort. No accessory muscle use.  Cardiovascular: Regular rate and rhythm, no murmurs / rubs / gallops. No extremity edema. 2+ pedal pulses. No carotid bruits.  Abdomen: no tenderness, no masses palpated. No hepatosplenomegaly. Bowel sounds positive.  Musculoskeletal: no clubbing / cyanosis. No joint deformity upper and lower extremities. Good ROM, no contractures. Normal muscle tone.  Skin: no rashes, lesions,  ulcers. No induration Neurologic: CN 2-12 grossly intact. Sensation intact, DTR normal. Strength 5/5 in all 4.  Psychiatric: Normal judgment and insight. Alert and oriented x 3. Normal mood.     Labs on Admission: I have personally reviewed following labs and imaging studies  CBC: Recent Labs  Lab 08/12/24 0939  WBC 11.7*  NEUTROABS 10.8*  HGB 11.2*  HCT 32.3*  MCV 97.0  PLT 200   Basic Metabolic Panel: Recent Labs  Lab 08/12/24 0939  NA 137  K 3.9  CL 99  CO2 24  GLUCOSE 83  BUN 28*  CREATININE 1.40*  CALCIUM 9.7   GFR: CrCl cannot be calculated (Unknown ideal weight.). Liver Function Tests: Recent Labs  Lab 08/12/24 0939  AST 43*  ALT 36  ALKPHOS 59  BILITOT 0.9  PROT 5.9*  ALBUMIN 3.6   No results for input(s): LIPASE, AMYLASE in the  last 168 hours. No results for input(s): AMMONIA in the last 168 hours. Coagulation Profile: No results for input(s): INR, PROTIME in the last 168 hours. Cardiac Enzymes: No results for input(s): CKTOTAL, CKMB, CKMBINDEX, TROPONINI in the last 168 hours. BNP (last 3 results) No results for input(s): PROBNP in the last 8760 hours. HbA1C: No results for input(s): HGBA1C in the last 72 hours. CBG: No results for input(s): GLUCAP in the last 168 hours. Lipid Profile: No results for input(s): CHOL, HDL, LDLCALC, TRIG, CHOLHDL, LDLDIRECT in the last 72 hours. Thyroid  Function Tests: No results for input(s): TSH, T4TOTAL, FREET4, T3FREE, THYROIDAB in the last 72 hours. Anemia Panel: No results for input(s): VITAMINB12, FOLATE, FERRITIN, TIBC, IRON, RETICCTPCT in the last 72 hours. Urine analysis:    Component Value Date/Time   COLORURINE YELLOW (Coffey) 08/12/2024 1047   APPEARANCEUR CLEAR (Coffey) 08/12/2024 1047   LABSPEC 1.013 08/12/2024 1047   PHURINE 6.0 08/12/2024 1047   GLUCOSEU NEGATIVE 08/12/2024 1047   HGBUR SMALL (Coffey) 08/12/2024 1047   BILIRUBINUR NEGATIVE  08/12/2024 1047   KETONESUR 5 (Coffey) 08/12/2024 1047   PROTEINUR NEGATIVE 08/12/2024 1047   NITRITE NEGATIVE 08/12/2024 1047   LEUKOCYTESUR NEGATIVE 08/12/2024 1047    Radiological Exams on Admission: DG Chest 2 View Result Date: 08/12/2024 CLINICAL DATA:  Fever, weakness EXAM: CHEST - 2 VIEW COMPARISON:  None Available. FINDINGS: The heart size and mediastinal contours are within normal limits. Both lungs are clear. The visualized skeletal structures are unremarkable. IMPRESSION: No active cardiopulmonary disease. Electronically Signed   By: Lynwood Landy Raddle M.D.   On: 08/12/2024 10:08   DG Elbow Complete Right Result Date: 08/12/2024 CLINICAL DATA:  Right elbow pain and laceration after fall EXAM: RIGHT ELBOW - COMPLETE 3+ VIEW COMPARISON:  None Available. FINDINGS: There is no evidence of fracture, dislocation, or joint effusion. There is no evidence of arthropathy or other focal bone abnormality. Soft tissues are unremarkable. IMPRESSION: Negative. Electronically Signed   By: Lynwood Landy Raddle M.D.   On: 08/12/2024 10:06    EKG: Independently reviewed.  Sinus tachycardia, no acute ST changes.  Assessment/Plan Principal Problem:   Sepsis (HCC) Active Problems:   Sepsis due to [facility])   Thrush of mouth and esophagus (HCC)  (please populate well all problems here in Problem List. (For example, if patient is on BP meds at home and you resume or decide to hold them, it is Coffey problem that needs to be her. Same for CAD, COPD, HLD and so on)  Sepsis, with acute endorgan damage Oropharyngeal candidiasis, rule out Candida fungemia  - Sepsis as evidenced by new onset fever, tachycardia, source infection, likely oral thrush.  Acute endorgan damage is Coffey concurrent AKI.  Patient however denied any throat pain or trouble swallowing or pain when swallowing.  Given that he is immune compromised, we will cover him with broad-spectrum antibiotics cefepime  and IV fluconazole . - Blood culture, consider  echocardiogram. - Received 1 L IV bolus in the ED, will continue maintenance IV fluid  Oral thrush -As above - Also check strep Coffey PCR  AKI - Appears to be prerenal probably secondary to sepsis - Received IV bolus - Continue maintenance IV fluid - UTI ruled out - Check renal ultrasound  Psoriasic arthritis - Hold off Rinvoq - Change prednisone dose to stress dose of hydrocortisone  - Weekly MTX   DVT prophylaxis: Lovenox  Code Status: Full code Family Communication:  Disposition Plan: Patient sick with sepsis, with Coffey baseline immunocompromise condition  with fungal infection requiring IV antifungal treatment, expect more than 2 midnight hospital stay. Consults called: none  Admission status: Tele admit   Cort ONEIDA Mana MD [facility] Pager [phone]  08/12/2024, 12:11 PM       [1]  Allergies Allergen Reactions   Geocillin [Carbenicillin] Swelling   Septra [Sulfamethoxazole-Trimethoprim] Swelling   Tetracyclines & Related Swelling   "

## 2024-08-12 NOTE — ED Notes (Signed)
 Pt has been feeling weak for the last few days. Pt states that the weakness comes and goes and that he feels like he hasn't had much to drink recently. Pt has a skin tear on his right elbow. Pt is hard of hearing.

## 2024-08-12 NOTE — ED Notes (Signed)
 Pt declined removing clothing or taking off shoes. Pt laying in bed warm blanket provided call bell in reach. Lights turned down.

## 2024-08-12 NOTE — Consult Note (Signed)
 ED Pharmacy Antibiotic Sign Off An antibiotic consult was received from an ED provider for vancomycin  per pharmacy dosing for sepsis. A chart review was completed to assess appropriateness.   The following one time order(s) were placed:  Vancomycin  1500 mg IV x 1  Further antibiotic and/or antibiotic pharmacy consults should be ordered by the admitting provider if indicated.   Thank you for allowing pharmacy to be a part of this patient's care.   Kayla JULIANNA Blew, [facility]  Clinical Pharmacist 08/12/24 11:19 AM

## 2024-08-12 NOTE — ED Provider Notes (Signed)
 "  [facility] Provider Note    Event Date/Time   First MD Initiated Contact with Patient 08/12/24 [phone]     (approximate)   History   Fall   HPI  Jackson Coffey is a 84 y.o. male history of arthritis, giant cell arteritis, kidney stones presents emergency department complaining of fever, chills, night sweats for 3 days.  Patient is on  Rinvoq for giant cell arteritis.  He did trip over his feet states his feet got tangled up, fell to the ground hitting his elbow and landing on his bottom.  No head injury.  Does take aspirin  today.  No headache.  Denies cough, congestion, burning with urination.  No vomiting or diarrhea.  No weakness, slurred speech etc.      Physical Exam   Triage Vital Signs: ED Triage Vitals [08/12/24 0859]  Encounter Vitals Group     BP 115/80     Girls Systolic BP Percentile      Girls Diastolic BP Percentile      Boys Systolic BP Percentile      Boys Diastolic BP Percentile      Pulse Rate (!) 125     Resp 16     Temp (!) 100.8 F (38.2 C)     Temp Source Oral     SpO2 95 %     Weight 143 lb 15.4 oz (65.3 kg)     Height      Head Circumference      Peak Flow      Pain Score 0     Pain Loc      Pain Education      Exclude from Growth Chart     Most recent vital signs: Vitals:   08/12/24 0859 08/12/24 1053  BP: 115/80 (!) 117/57  Pulse: (!) 125 (!) 102  Resp: 16 17  Temp: (!) 100.8 F (38.2 C) (!) 102.4 F (39.1 C)  SpO2: 95% 95%     General: Awake, no distress.   CV:  Good peripheral perfusion.  Tachycardic Resp:  Normal effort. Lungs CTA Abd:  No distention.  Nontender Other:  Right elbow with large skin tear noted at the olecranon, full range of motion, neurovascular intact, area is tender to palpation, spine is nontender, hips nontender, knees nontender   ED Results / Procedures / Treatments   Labs (all labs ordered are listed, but only abnormal results are displayed) Labs Reviewed  LACTIC  ACID, PLASMA - Abnormal; Notable for the following components:      Result Value   Lactic Acid, Venous 2.4 (*)    All other components within normal limits  COMPREHENSIVE METABOLIC PANEL WITH GFR - Abnormal; Notable for the following components:   BUN 28 (*)    Creatinine, Ser 1.40 (*)    Total Protein 5.9 (*)    AST 43 (*)    GFR, Estimated 50 (*)    All other components within normal limits  CBC WITH DIFFERENTIAL/PLATELET - Abnormal; Notable for the following components:   WBC 11.7 (*)    RBC 3.33 (*)    Hemoglobin 11.2 (*)    HCT 32.3 (*)    Neutro Abs 10.8 (*)    Lymphs Abs 0.4 (*)    All other components within normal limits  URINALYSIS, W/ REFLEX TO CULTURE (INFECTION SUSPECTED) - Abnormal; Notable for the following components:   Color, Urine YELLOW (*)    APPearance CLEAR (*)    Hgb urine dipstick  SMALL (*)    Ketones, ur 5 (*)    All other components within normal limits  RESP PANEL BY RT-PCR (RSV, FLU A&B, COVID)  [id number]  CULTURE, BLOOD (ROUTINE X 2)  CULTURE, BLOOD (ROUTINE X 2)  SARS CORONAVIRUS 2 BY RT PCR  LACTIC ACID, PLASMA     EKG  EKG   RADIOLOGY Chest x-ray, x-ray right elbow    PROCEDURES:   .Critical Care  Performed by: Gasper Devere ORN, PA-C Authorized by: Gasper Devere ORN, PA-C   Critical care provider statement:    Critical care time (minutes):  30   Critical care time was exclusive of:  Separately billable procedures and treating other patients   Critical care was necessary to treat or prevent imminent or life-threatening deterioration of the following conditions:  Sepsis   Critical care was time spent personally by me on the following activities:  Development of treatment plan with patient or surrogate, discussions with consultants, evaluation of patient's response to treatment, examination of patient, ordering and review of laboratory studies, ordering and review of radiographic studies, ordering and performing treatments and  interventions, pulse oximetry, re-evaluation of patient's condition, review of old charts and obtaining history from patient or surrogate   Care discussed with: admitting provider     Critical Care: Yes Chief Complaint  Patient presents with   Fall      MEDICATIONS ORDERED IN ED: Medications  ceFEPIme  (MAXIPIME ) 2 g in sodium chloride  0.9 % 100 mL IVPB (has no administration in time range)  ibuprofen  (ADVIL ) tablet 600 mg (has no administration in time range)  vancomycin  (VANCOREADY) IVPB 1500 mg/300 mL (has no administration in time range)  sodium chloride  0.9 % bolus 1,000 mL (1,000 mLs Intravenous New Bag/Given 08/12/24 1011)  acetaminophen  (TYLENOL ) tablet 650 mg (650 mg Oral Given 08/12/24 1055)     IMPRESSION / MDM / ASSESSMENT AND PLAN / ED COURSE  I reviewed the triage vital signs and the nursing notes.                              Differential diagnosis includes, but is not limited to, sepsis, CAP, COVID, influenza, RSV, UTI, fall, fracture, contusion  Patient's presentation is most consistent with acute presentation with potential threat to life or bodily function.   Cardiac monitor  Medications given: Normal saline 1 L IV, Tylenol  for pain/fever, bank per pharmacy, cefepime  per pharmacy  WBC elevated at 11.7, lactic acid elevated 2.4 indicating sepsis.  Chest x-ray, independent review interpretation by me as being negative for any acute abnormality  X-ray right elbow, independent review interpretation by me as being negative for acute abnormality  Due to sepsis we will go ahead and start vancomycin , called pharmacy due to patient's allergies and he recommends cefepime .  Will start these antibiotics per sepsis protocols, normal saline bolus was given on arrival.  Temperature has increased even after the Tylenol .  Will consider ibuprofen  at this time.  UA reassuring  Consult hospitalist Spoke with Dr. Laurita, will be admitting the patient for sepsis.      FINAL  CLINICAL IMPRESSION(S) / ED DIAGNOSES   Final diagnoses:  Acute sepsis (HCC)  Fall, initial encounter  Skin tear of right elbow without complication, initial encounter     Rx / DC Orders   ED Discharge Orders     None        Note:  This document was prepared using Dragon voice  recognition software and may include unintentional dictation errors.    Gasper Devere ORN, PA-C 08/12/24 1200    Jacolyn Pae, MD 08/12/24 1525  "

## 2024-08-13 LAB — BASIC METABOLIC PANEL WITH GFR
Anion gap: 9 (ref 5–15)
BUN: 31 mg/dL — ABNORMAL HIGH (ref 8–23)
CO2: 20 mmol/L — ABNORMAL LOW (ref 22–32)
Calcium: 7.9 mg/dL — ABNORMAL LOW (ref 8.9–10.3)
Chloride: 109 mmol/L (ref 98–111)
Creatinine, Ser: 1.3 mg/dL — ABNORMAL HIGH (ref 0.61–1.24)
GFR, Estimated: 55 mL/min — ABNORMAL LOW
Glucose, Bld: 102 mg/dL — ABNORMAL HIGH (ref 70–99)
Potassium: 3.9 mmol/L (ref 3.5–5.1)
Sodium: 138 mmol/L (ref 135–145)

## 2024-08-13 LAB — CBC WITH DIFFERENTIAL/PLATELET
Abs Immature Granulocytes: 0.09 10*3/uL — ABNORMAL HIGH (ref 0.00–0.07)
Basophils Absolute: 0 10*3/uL (ref 0.0–0.1)
Basophils Relative: 0 %
Eosinophils Absolute: 0 10*3/uL (ref 0.0–0.5)
Eosinophils Relative: 0 %
HCT: 27 % — ABNORMAL LOW (ref 39.0–52.0)
Hemoglobin: 8.8 g/dL — ABNORMAL LOW (ref 13.0–17.0)
Immature Granulocytes: 1 %
Lymphocytes Relative: 4 %
Lymphs Abs: 0.4 10*3/uL — ABNORMAL LOW (ref 0.7–4.0)
MCH: 33.2 pg (ref 26.0–34.0)
MCHC: 32.6 g/dL (ref 30.0–36.0)
MCV: 101.9 fL — ABNORMAL HIGH (ref 80.0–100.0)
Monocytes Absolute: 0.2 10*3/uL (ref 0.1–1.0)
Monocytes Relative: 2 %
Neutro Abs: 9.6 10*3/uL — ABNORMAL HIGH (ref 1.7–7.7)
Neutrophils Relative %: 93 %
Platelets: 173 10*3/uL (ref 150–400)
RBC: 2.65 MIL/uL — ABNORMAL LOW (ref 4.22–5.81)
RDW: 15.3 % (ref 11.5–15.5)
WBC: 10.4 10*3/uL (ref 4.0–10.5)
nRBC: 0 % (ref 0.0–0.2)

## 2024-08-13 LAB — MRSA NEXT GEN BY PCR, NASAL: MRSA by PCR Next Gen: NOT DETECTED

## 2024-08-13 MED ORDER — FLUCONAZOLE 100 MG PO TABS
100.0000 mg | ORAL_TABLET | Freq: Once | ORAL | Status: AC
  Administered 2024-08-13: 100 mg via ORAL
  Filled 2024-08-13: qty 1

## 2024-08-13 MED ORDER — FLUCONAZOLE 100 MG PO TABS
200.0000 mg | ORAL_TABLET | Freq: Every day | ORAL | Status: AC
  Administered 2024-08-14: 200 mg via ORAL
  Filled 2024-08-13: qty 2

## 2024-08-13 NOTE — Plan of Care (Signed)

## 2024-08-13 NOTE — Progress Notes (Signed)
 " PROGRESS NOTE    Jackson Coffey  FMW:[id number] DOB: [birth year] DOA: 08/12/2024 PCP: Edman Marsa PARAS, DO   Assessment & Plan:   Principal Problem:   Sepsis [facility]) Active Problems:   Sepsis due to Candida [facility])   Thrush of mouth and esophagus (HCC)  Assessment and Plan: Sepsis: met criteria w/ fever, tachycardia, & oral thrush. Continue on fluconazole . On chronic steroids. Blood cxs NGTD. CXR is WNL. UA is unremarkable   Oropharyngeal candidiasis: continue on fluconazole . Improving    AKI: Cr is trending down from day prior. Avoid nephrotoxic meds    Psoriasic arthritis: holding home dose of rinvoq. Continue on stress dose steroids again today. On weekly MTX        DVT prophylaxis: lovenox   Code Status: full  Family Communication:  Disposition Plan:  likely d/c back home   Level of care: Telemetry  Status is: Inpatient Remains inpatient appropriate because: severity of illness    Consultants:    Procedures:  Antimicrobials: cefepime    Subjective: Pt c/o fatigue   Objective: Vitals:   08/12/24 1800 08/12/24 2006 08/13/24 0438 08/13/24 0836  BP: 128/77 119/69 137/68 118/65  Pulse: 90 94 74 88  Resp: 16 16 16 16   Temp:  97.9 F (36.6 C) (!) 97.4 F (36.3 C) 98 F (36.7 C)  TempSrc:  Oral Oral   SpO2: 100% 100% 94% 94%  Weight:  65.3 kg    Height:        Intake/Output Summary (Last 24 hours) at 08/13/2024 1036 Last data filed at 08/13/2024 0600 Gross per 24 hour  Intake 300 ml  Output 750 ml  Net -450 ml   Filed Weights   08/12/24 0859 08/12/24 1225 08/12/24 2006  Weight: 65.3 kg 61.2 kg 65.3 kg    Examination:  General exam: appears calm and comfortable   Respiratory system: clear breath sounds b;  Cardiovascular system: S1/S2+. No rubs, gallops or clicks. No pedal edema. Gastrointestinal system: abd is soft, NT, ND & hypoactive bowel sounds Central nervous system: Alert and oriented. Moves all extremities  Psychiatry:  Judgement and insight appears at baseline. Appropriate mood and affect    Data Reviewed: I have personally reviewed following labs and imaging studies  CBC: Recent Labs  Lab 08/12/24 0939 08/13/24 0559  WBC 11.7* 10.4  NEUTROABS 10.8* 9.6*  HGB 11.2* 8.8*  HCT 32.3* 27.0*  MCV 97.0 101.9*  PLT 200 173   Basic Metabolic Panel: Recent Labs  Lab 08/12/24 0939 08/13/24 0559  NA 137 138  K 3.9 3.9  CL 99 109  CO2 24 20*  GLUCOSE 83 102*  BUN 28* 31*  CREATININE 1.40* 1.30*  CALCIUM 9.7 7.9*   GFR: Estimated Creatinine Clearance: 39.8 mL/min (A) (by C-G formula based on SCr of 1.3 mg/dL (H)). Liver Function Tests: Recent Labs  Lab 08/12/24 0939  AST 43*  ALT 36  ALKPHOS 59  BILITOT 0.9  PROT 5.9*  ALBUMIN 3.6   No results for input(s): LIPASE, AMYLASE in the last 168 hours. No results for input(s): AMMONIA in the last 168 hours. Coagulation Profile: No results for input(s): INR, PROTIME in the last 168 hours. Cardiac Enzymes: No results for input(s): CKTOTAL, CKMB, CKMBINDEX, TROPONINI in the last 168 hours. BNP (last 3 results) No results for input(s): PROBNP in the last 8760 hours. HbA1C: No results for input(s): HGBA1C in the last 72 hours. CBG: No results for input(s): GLUCAP in the last 168 hours. Lipid Profile: No results for  input(s): CHOL, HDL, LDLCALC, TRIG, CHOLHDL, LDLDIRECT in the last 72 hours. Thyroid  Function Tests: No results for input(s): TSH, T4TOTAL, FREET4, T3FREE, THYROIDAB in the last 72 hours. Anemia Panel: No results for input(s): VITAMINB12, FOLATE, FERRITIN, TIBC, IRON, RETICCTPCT in the last 72 hours. Sepsis Labs: Recent Labs  Lab 08/12/24 9060 08/12/24 1817 08/12/24 2131  LATICACIDVEN 2.4* 3.9* 1.8    Recent Results (from the past 240 hours)  Resp panel by RT-PCR (RSV, Flu A&B, Covid) Anterior Nasal Swab     Status: None   Collection Time: 08/12/24  9:01 AM    Specimen: Anterior Nasal Swab  Result Value Ref Range Status   SARS Coronavirus 2 by RT PCR NEGATIVE NEGATIVE Final    Comment: (NOTE) SARS-CoV-2 target nucleic acids are NOT DETECTED.  The SARS-CoV-2 RNA is generally detectable in upper respiratory specimens during the acute phase of infection. The lowest concentration of SARS-CoV-2 viral copies this assay can detect is 138 copies/mL. A negative result does not preclude SARS-Cov-2 infection and should not be used as the sole basis for treatment or other patient management decisions. A negative result may occur with  improper specimen collection/handling, submission of specimen other than nasopharyngeal swab, presence of viral mutation(s) within the areas targeted by this assay, and inadequate number of viral copies(<138 copies/mL). A negative result must be combined with clinical observations, patient history, and epidemiological information. The expected result is Negative.  Fact Sheet for Patients:  bloggercourse.com  Fact Sheet for Healthcare Providers:  seriousbroker.it  This test is no t yet approved or cleared by the [place]  FDA and  has been authorized for detection and/or diagnosis of SARS-CoV-2 by FDA under an Emergency Use Authorization (EUA). This EUA will remain  in effect (meaning this test can be used) for the duration of the COVID-19 declaration under Section 564(b)(1) of the Act, 21 U.S.C.section 360bbb-3(b)(1), unless the authorization is terminated  or revoked sooner.       Influenza A by PCR NEGATIVE NEGATIVE Final   Influenza B by PCR NEGATIVE NEGATIVE Final    Comment: (NOTE) The Xpert Xpress SARS-CoV-2/FLU/RSV plus assay is intended as an aid in the diagnosis of influenza from Nasopharyngeal swab specimens and should not be used as a sole basis for treatment. Nasal washings and aspirates are unacceptable for Xpert Xpress  SARS-CoV-2/FLU/RSV testing.  Fact Sheet for Patients: bloggercourse.com  Fact Sheet for Healthcare Providers: seriousbroker.it  This test is not yet approved or cleared by the [place]  FDA and has been authorized for detection and/or diagnosis of SARS-CoV-2 by FDA under an Emergency Use Authorization (EUA). This EUA will remain in effect (meaning this test can be used) for the duration of the COVID-19 declaration under Section 564(b)(1) of the Act, 21 U.S.C. section 360bbb-3(b)(1), unless the authorization is terminated or revoked.     Resp Syncytial Virus by PCR NEGATIVE NEGATIVE Final    Comment: (NOTE) Fact Sheet for Patients: bloggercourse.com  Fact Sheet for Healthcare Providers: seriousbroker.it  This test is not yet approved or cleared by the [place]  FDA and has been authorized for detection and/or diagnosis of SARS-CoV-2 by FDA under an Emergency Use Authorization (EUA). This EUA will remain in effect (meaning this test can be used) for the duration of the COVID-19 declaration under Section 564(b)(1) of the Act, 21 U.S.C. section 360bbb-3(b)(1), unless the authorization is terminated or revoked.  Performed at [facility], [address] [place], [place] [address]   Blood Culture (routine x 2)  Status: None (Preliminary result)   Collection Time: 08/12/24  9:39 AM   Specimen: BLOOD  Result Value Ref Range Status   Specimen Description BLOOD LEFT ANTECUBITAL  Final   Special Requests   Final    BOTTLES DRAWN AEROBIC AND ANAEROBIC Blood Culture adequate volume   Culture   Final    NO GROWTH < 24 HOURS Performed at [facility], [address] [place], [place] [address]    Report Status PENDING  Incomplete  Blood Culture (routine x 2)     Status: None (Preliminary result)   Collection Time: 08/12/24 10:15 AM   Specimen: BLOOD   Result Value Ref Range Status   Specimen Description BLOOD BLOOD RIGHT FOREARM  Final   Special Requests   Final    BOTTLES DRAWN AEROBIC AND ANAEROBIC Blood Culture results may not be optimal due to an inadequate volume of blood received in culture bottles   Culture   Final    NO GROWTH < 24 HOURS Performed at [facility], [address] [place], [place] [address]    Report Status PENDING  Incomplete  MRSA Next Gen by PCR, Nasal     Status: None   Collection Time: 08/13/24  1:15 AM   Specimen: Nasal Mucosa; Nasal Swab  Result Value Ref Range Status   MRSA by PCR Next Gen NOT DETECTED NOT DETECTED Final    Comment: (NOTE) The GeneXpert MRSA Assay (FDA approved for NASAL specimens only), is one component of a comprehensive MRSA colonization surveillance program. It is not intended to diagnose MRSA infection nor to guide or monitor treatment for MRSA infections. Test performance is not FDA approved in patients less than 56 years old. Performed at [facility], [address] [place], [place] [address]          Radiology Studies: US  RENAL Result Date: 08/12/2024 EXAM: RETROPERITONEAL ULTRASOUND OF THE KIDNEYS 08/12/2024 03:02:59 PM TECHNIQUE: Real-time ultrasonography of the retroperitoneum, specifically the kidneys and urinary bladder, was performed. COMPARISON: None available. CLINICAL HISTORY: 409830 AKI (acute kidney injury) 409830 Acute kidney injury. ICD [phone] Acute kidney injury. FINDINGS: RIGHT KIDNEY: Right kidney measures 10.7 x 4.6 x 4.6 cm. Normal cortical echogenicity. No hydronephrosis. No calculus. Upper pole cyst measuring 2.5 cm. No mass. LEFT KIDNEY: Left kidney measures 11 x 5.2 x 4.1 cm. Normal cortical echogenicity. No hydronephrosis. No calculus. No mass. URINARY BLADDER: Mild circumferential wall thickening of the urinary bladder with trabeculation. Post void residual of 35 ml. IMPRESSION: 1. Mild circumferential wall thickening of the  urinary bladder with trabeculation, which may be due to chronic bladder outlet obstruction if there is a history of prostatomegaly or neurogenic bladder. Correlation with urinalysis recommended to exclude acute cystitis. 2. No hydronephrosis or nephrolithiasis. Electronically signed by: Rogelia Myers MD 08/12/2024 03:08 PM EST RP Workstation: [id number]   DG Chest 2 View Result Date: 08/12/2024 CLINICAL DATA:  Fever, weakness EXAM: CHEST - 2 VIEW COMPARISON:  None Available. FINDINGS: The heart size and mediastinal contours are within normal limits. Both lungs are clear. The visualized skeletal structures are unremarkable. IMPRESSION: No active cardiopulmonary disease. Electronically Signed   By: Lynwood Landy Raddle M.D.   On: 08/12/2024 10:08   DG Elbow Complete Right Result Date: 08/12/2024 CLINICAL DATA:  Right elbow pain and laceration after fall EXAM: RIGHT ELBOW - COMPLETE 3+ VIEW COMPARISON:  None Available. FINDINGS: There is no evidence of fracture, dislocation, or joint effusion. There is no evidence of arthropathy or other  focal bone abnormality. Soft tissues are unremarkable. IMPRESSION: Negative. Electronically Signed   By: Lynwood Landy Raddle M.D.   On: 08/12/2024 10:06        Scheduled Meds:  aspirin  EC  81 mg Oral Daily   enoxaparin  (LOVENOX ) injection  30 mg Subcutaneous Q24H   finasteride   5 mg Oral Daily   fluconazole   100 mg Oral Daily   hydrocortisone  sod succinate (SOLU-CORTEF ) inj  100 mg Intravenous Q12H   pantoprazole   40 mg Oral Daily   triamcinolone  cream   Topical BID   Continuous Infusions:  ceFEPime  (MAXIPIME ) IV 2 g (08/13/24 0016)     LOS: 1 day       Anthony CHRISTELLA Pouch, MD [facility] Pager [phone]  If 7PM-7AM, please contact night-coverage [email or web address]  08/13/2024, 10:36 AM   "

## 2024-08-13 NOTE — Progress Notes (Signed)
 Mobility Specialist - Progress Note On Room Air  Post-mobility: HR-89, SPO2-91%   08/13/24 1049  Mobility  Activity Ambulated with assistance  Level of Assistance Independent after set-up  Assistive Device None  Distance Ambulated (ft) 35 ft  Range of Motion/Exercises All extremities  Activity Response Tolerated well  Mobility visit 1 Mobility  Mobility Specialist Start Time (ACUTE ONLY) 1031  Mobility Specialist Stop Time (ACUTE ONLY) 1041  Mobility Specialist Time Calculation (min) (ACUTE ONLY) 10 min   Pt was at the EOB with guest in the room on RA upon entry. Pt agreed to mobility. Pt was able to STS independently with no AD. Pt did state that he has LOB at times so CGA was used. Pt ambulated within room. Pt vitals were taken at end of activity. After activity pt is at the EOB with guest and nurse in the room upon exit. Needs are in reach.  Clem Rodes Mobility Specialist 08/13/24, 11:49 AM

## 2024-08-13 NOTE — Progress Notes (Signed)
 Pharmacy Antibiotic Note  Jackson Coffey is a 84 y.o. male admitted on 08/12/2024 with sepsis from candida.  Pharmacy has been consulted for fluconnazole dosing.  Plan: Fluconazole  200 mg iv once followed by 100 mg PO daily ordered. Will increase dosing to 200 mg daily for systemic infection.   Height: 5' 9 (175.3 cm) Weight: 65.3 kg (143 lb 15.4 oz) IBW/kg (Calculated) : 70.7  Temp (24hrs), Avg:98.2 F (36.8 C), Min:97.4 F (36.3 C), Max:99.4 F (37.4 C)  Recent Labs  Lab 08/12/24 0939 08/12/24 1817 08/12/24 2131 08/13/24 0559  WBC 11.7*  --   --  10.4  CREATININE 1.40*  --   --  1.30*  LATICACIDVEN 2.4* 3.9* 1.8  --     Estimated Creatinine Clearance: 39.8 mL/min (A) (by C-G formula based on SCr of 1.3 mg/dL (H)).    Allergies[1]  Thank you for allowing pharmacy to be a part of this patients care.  Bari Hamilton D 08/13/2024 12:02 PM     [1]  Allergies Allergen Reactions   Geocillin [Carbenicillin] Swelling   Septra [Sulfamethoxazole-Trimethoprim] Swelling   Tetracyclines & Related Swelling

## 2024-08-13 NOTE — Plan of Care (Signed)
  Problem: Clinical Measurements: Goal: Respiratory complications will improve Outcome: Progressing Goal: Cardiovascular complication will be avoided Outcome: Progressing   Problem: Activity: Goal: Risk for activity intolerance will decrease Outcome: Progressing   Problem: Nutrition: Goal: Adequate nutrition will be maintained Outcome: Progressing   Problem: Elimination: Goal: Will not experience complications related to urinary retention Outcome: Progressing   Problem: Pain Managment: Goal: General experience of comfort will improve and/or be controlled Outcome: Progressing   Problem: Safety: Goal: Ability to remain free from injury will improve Outcome: Progressing

## 2024-08-14 ENCOUNTER — Inpatient Hospital Stay

## 2024-08-14 LAB — BASIC METABOLIC PANEL WITH GFR
Anion gap: 11 (ref 5–15)
BUN: 30 mg/dL — ABNORMAL HIGH (ref 8–23)
CO2: 20 mmol/L — ABNORMAL LOW (ref 22–32)
Calcium: 8.1 mg/dL — ABNORMAL LOW (ref 8.9–10.3)
Chloride: 111 mmol/L (ref 98–111)
Creatinine, Ser: 1.11 mg/dL (ref 0.61–1.24)
GFR, Estimated: >60 mL/min
Glucose, Bld: 107 mg/dL — ABNORMAL HIGH (ref 70–99)
Potassium: 3 mmol/L — ABNORMAL LOW (ref 3.5–5.1)
Sodium: 142 mmol/L (ref 135–145)

## 2024-08-14 LAB — CBC
HCT: 27.2 % — ABNORMAL LOW (ref 39.0–52.0)
Hemoglobin: 9 g/dL — ABNORMAL LOW (ref 13.0–17.0)
MCH: 32.8 pg (ref 26.0–34.0)
MCHC: 33.1 g/dL (ref 30.0–36.0)
MCV: 99.3 fL (ref 80.0–100.0)
Platelets: 196 10*3/uL (ref 150–400)
RBC: 2.74 MIL/uL — ABNORMAL LOW (ref 4.22–5.81)
RDW: 15.3 % (ref 11.5–15.5)
WBC: 11.2 10*3/uL — ABNORMAL HIGH (ref 4.0–10.5)
nRBC: 0 % (ref 0.0–0.2)

## 2024-08-14 LAB — CULTURE, BLOOD (ROUTINE X 2)
Culture: NO GROWTH
Culture: NO GROWTH
Special Requests: ADEQUATE

## 2024-08-14 MED ORDER — SODIUM CHLORIDE 0.9 % IV SOLN
1.0000 g | INTRAVENOUS | Status: AC
  Administered 2024-08-14: 1 g via INTRAVENOUS
  Filled 2024-08-14: qty 10

## 2024-08-14 MED ORDER — IOHEXOL 350 MG/ML SOLN
75.0000 mL | Freq: Once | INTRAVENOUS | Status: AC | PRN
  Administered 2024-08-14: 75 mL via INTRAVENOUS

## 2024-08-14 MED ORDER — ENOXAPARIN SODIUM 40 MG/0.4ML IJ SOSY
40.0000 mg | PREFILLED_SYRINGE | INTRAMUSCULAR | Status: AC
  Administered 2024-08-14: 40 mg via SUBCUTANEOUS
  Filled 2024-08-14: qty 0.4

## 2024-08-14 MED ORDER — SODIUM BICARBONATE 650 MG PO TABS
650.0000 mg | ORAL_TABLET | Freq: Two times a day (BID) | ORAL | Status: AC
  Administered 2024-08-14: 650 mg via ORAL
  Filled 2024-08-14: qty 1

## 2024-08-14 MED ORDER — AZITHROMYCIN 250 MG PO TABS
500.0000 mg | ORAL_TABLET | Freq: Every day | ORAL | Status: AC
  Administered 2024-08-14: 500 mg via ORAL
  Filled 2024-08-14: qty 2

## 2024-08-14 MED ORDER — POTASSIUM CHLORIDE CRYS ER 20 MEQ PO TBCR
40.0000 meq | EXTENDED_RELEASE_TABLET | Freq: Two times a day (BID) | ORAL | Status: AC
  Administered 2024-08-14 (×2): 40 meq via ORAL
  Filled 2024-08-14 (×2): qty 2

## 2024-08-14 NOTE — Plan of Care (Signed)

## 2024-08-14 NOTE — Evaluation (Signed)
 Occupational Therapy Evaluation Patient Details Name: Jackson Coffey MRN: [id number] DOB: [birth year] Today's Date: 08/14/2024   History of Present Illness   Pt is an 84 y.o. male presented with worsening fever, chills, and fall admitted for sepsis d/t oral candida, AKI. PMH of giant cell arteritis, rheumatoid arthritis.     Clinical Impressions Pt was seen for OT evaluation this date. PTA, pt resides at home with his wife and is typically indep without use of AD for ADLs and mobility both household and community distances.   Pt presents with deficits in strength, activity tolerance, balance and safety awareness limiting their ability to perform ADL management at baseline level. Pt currently requires MOD I for bed mobility tasks and CGA for STS and functional mobility ~20 ft using RW. Supervision for seated LB dressing tasks to doff/donn bil socks. Pt on 2L throughout session with sp02 at 91% at rest and desat to 80% with activity, improving to 91% with rest and PLB. Pt with mild DOE/SOB with activity during session. He is far from his baseline at this time with limited strength and endurance, will follow acutely to promote return to PLOF. Recommend OT follow up upon DC as well as BSC or shower chair to promote energy conservation during ADL performance.  SaO2 on 2L at rest = 91% SaO2 on 2 liters of O2 while ambulating = 80%      If plan is discharge home, recommend the following:   A little help with walking and/or transfers;A little help with bathing/dressing/bathroom;Assistance with cooking/housework;Assist for transportation;Help with stairs or ramp for entrance     Functional Status Assessment   Patient has had a recent decline in their functional status and demonstrates the ability to make significant improvements in function in a reasonable and predictable amount of time.     Equipment Recommendations   Tub/shower seat;BSC/3in1     Recommendations for Other  Services         Precautions/Restrictions         Mobility Bed Mobility Overal bed mobility: Modified Independent             General bed mobility comments: no physical assist required; increased time/effort    Transfers Overall transfer level: Needs assistance Equipment used: Rolling walker (2 wheels) Transfers: Sit to/from Stand Sit to Stand: Contact guard assist           General transfer comment: stands from EOB with CGA and ambulated ~20 ft using RW +CGA      Balance Overall balance assessment: Needs assistance Sitting-balance support: Feet supported Sitting balance-Leahy Scale: Normal     Standing balance support: Bilateral upper extremity supported Standing balance-Leahy Scale: Fair Standing balance comment: x1 LOB that pt was able to self correct, utilized RW with CGA for safety                           ADL either performed or assessed with clinical judgement   ADL Overall ADL's : Needs assistance/impaired                     Lower Body Dressing: Supervision/safety;Sitting/lateral leans Lower Body Dressing Details (indicate cue type and reason): doff/don bil socks seated in recliner Toilet Transfer: Contact guard assist;Rolling walker (2 wheels) Toilet Transfer Details (indicate cue type and reason): bed to recliner         Functional mobility during ADLs: Contact guard assist;Rolling walker (2 wheels)  Vision Patient Visual Report: No change from baseline       Perception         Praxis         Pertinent Vitals/Pain Pain Assessment Pain Assessment: 0-10 Pain Score: 0-No pain Pain Location: no pain at rest, occasional hip pain chronic Pain Descriptors / Indicators: Aching Pain Intervention(s): Monitored during session     Extremity/Trunk Assessment Upper Extremity Assessment Upper Extremity Assessment: Generalized weakness;Overall WFL for tasks assessed   Lower Extremity Assessment Lower  Extremity Assessment: Generalized weakness       Communication Communication Communication: Impaired (wears 1 hearing aide that does not help much per wife) Factors Affecting Communication: Hearing impaired   Cognition Arousal: Alert Behavior During Therapy: WFL for tasks assessed/performed Cognition: No apparent impairments                               Following commands: Impaired       Cueing  General Comments      on 2L at rest sp02 91%; dropped to 80% with mobility on 2L but improved to 91% with PLB and rest   Exercises Other Exercises Other Exercises: Edu on role of OT in acute setting and DC recommendations.   Shoulder Instructions      Home Living Family/patient expects to be discharged to:: Private residence Living Arrangements: Spouse/significant other Available Help at Discharge: Family;Available 24 hours/day Type of Home: House Home Access: Stairs to enter [employer] of Steps: 3 Entrance Stairs-Rails: Right Home Layout: One level     Bathroom Shower/Tub: Chief Strategy Officer: Handicapped height     Home Equipment: Grab bars - tub/shower          Prior Functioning/Environment Prior Level of Function : Independent/Modified Independent;Driving             Mobility Comments: indep; 1 fall this week leading to hospitalization; amb household and community distances ADLs Comments: indep with ADL, IADLs    OT Problem List: Decreased strength;Decreased activity tolerance;Impaired balance (sitting and/or standing)   OT Treatment/Interventions: Self-care/ADL training;Therapeutic exercise;Patient/family education;Balance training;Energy conservation;Therapeutic activities;DME and/or AE instruction      OT Goals(Current goals can be found in the care plan section)   Acute Rehab OT Goals Patient Stated Goal: improve strength OT Goal Formulation: With patient/family Time For Goal Achievement:  08/28/24 Potential to Achieve Goals: Good ADL Goals Pt Will Perform Grooming: with modified independence;with set-up;standing;sitting Pt Will Perform Lower Body Dressing: sit to/from stand;sitting/lateral leans;with set-up;Independently Pt Will Transfer to Toilet: with supervision;with modified independence;ambulating Additional ADL Goal #1: Pt will demo implementation of 1 learned ECS during ADL performance 2/2 trials to prevent overexertion and maximize pt safety/indep.   OT Frequency:  Min 2X/week    Co-evaluation              AM-PAC OT 6 Clicks Daily Activity     Outcome Measure Help from another person eating meals?: None Help from another person taking care of personal grooming?: A Little Help from another person toileting, which includes using toliet, bedpan, or urinal?: A Little Help from another person bathing (including washing, rinsing, drying)?: A Little Help from another person to put on and taking off regular upper body clothing?: A Little Help from another person to put on and taking off regular lower body clothing?: A Little 6 Click Score: 19   End of Session Equipment Utilized During Treatment: Rolling walker (2 wheels);Oxygen  Nurse Communication: Mobility status  Activity Tolerance: Patient tolerated treatment well;Patient limited by fatigue Patient left: in chair;with call bell/phone within reach;with family/visitor present  OT Visit Diagnosis: Other abnormalities of gait and mobility (R26.89);Muscle weakness (generalized) (M62.81)                Time: 9199-9172 OT Time Calculation (min): 27 min Charges:  OT General Charges $OT Visit: 1 Visit OT Evaluation $OT Eval Moderate Complexity: 1 Mod OT Treatments $Self Care/Home Management : 8-22 mins Leandre Wien Chrismon, OTR/L 08/14/24, 9:26 AM  Hurshel Bouillon E Chrismon 08/14/2024, 9:23 AM

## 2024-08-14 NOTE — Progress Notes (Signed)
 " PROGRESS NOTE    Jackson Coffey  FMW:[id number] DOB: 22-Jun-1941 DOA: 08/12/2024 PCP: Edman Marsa PARAS, DO   Assessment & Plan:   Principal Problem:   Sepsis [facility]) Active Problems:   Sepsis due to Candida [facility])   Thrush of mouth and esophagus (HCC)  Assessment and Plan: Sepsis: met criteria w/ fever, tachycardia, & oral thrush. Continue on fluconazole . On chronic steroids. Blood cxs NGTD. CXR is WNL. UA is unremarkable   CAP: as per CTA chest. No PE as per CTA chest. Abxs changed to IV rocephin , azithromycin . Start bronchodilators & encourage incentive spirometry. Continue on stress dose steroids again today   Acute hypoxic respiratory failure: desaturated to 80 on RA when working with therapy. Likely secondary to CAP. Continue on supplemental oxygen and wean as tolerated   Oropharyngeal candidiasis: continue on fluconazole . Improving    AKI: Cr is trending down again today. Avoid nephrotoxic meds    Psoriasic arthritis: holding home dose of rinvoq. Continue on stressed dose steroids. On weekly MTX  Hypokalemia: potassium ordered   Metabolic acidosis: will start bicarb       DVT prophylaxis: lovenox   Code Status: full  Family Communication: discussed pt's care w/ pt's family at bedside and answered their questions  Disposition Plan:  likely d/c back home   Level of care: Telemetry  Status is: Inpatient Remains inpatient appropriate because: severity of illness    Consultants:    Procedures:  Antimicrobials: cefepime    Subjective: Pt c/o intermittent cough    Objective: Vitals:   08/13/24 2039 08/14/24 0450 08/14/24 0457 08/14/24 0836  BP: (!) 160/67  (!) 149/87 112/65  Pulse: (!) 101  (!) 51 95  Resp: 16  18 18   Temp: 99 F (37.2 C)  98.7 F (37.1 C) 98.8 F (37.1 C)  TempSrc:      SpO2: 92% (!) 88% 92% 92%  Weight:      Height:        Intake/Output Summary (Last 24 hours) at 08/14/2024 1032 Last data filed at 08/13/2024 2126 Gross  per 24 hour  Intake 240 ml  Output --  Net 240 ml   Filed Weights   08/12/24 0859 08/12/24 1225 08/12/24 2006  Weight: 65.3 kg 61.2 kg 65.3 kg    Examination:  General exam: appears comfortable  Respiratory system: decreased breath sounds b/l  Cardiovascular system: S1 & S2+. No rubs or clicks Gastrointestinal system: abd is soft, NT, ND & hypoactive bowel sounds Central nervous system: alert & oriented. Moves all extremities  Psychiatry: judgement and insight appears at baseline. Appropriate mood and affect    Data Reviewed: I have personally reviewed following labs and imaging studies  CBC: Recent Labs  Lab 08/12/24 0939 08/13/24 0559 08/14/24 0531  WBC 11.7* 10.4 11.2*  NEUTROABS 10.8* 9.6*  --   HGB 11.2* 8.8* 9.0*  HCT 32.3* 27.0* 27.2*  MCV 97.0 101.9* 99.3  PLT 200 173 196   Basic Metabolic Panel: Recent Labs  Lab 08/12/24 0939 08/13/24 0559 08/14/24 0531  NA 137 138 142  K 3.9 3.9 3.0*  CL 99 109 111  CO2 24 20* 20*  GLUCOSE 83 102* 107*  BUN 28* 31* 30*  CREATININE 1.40* 1.30* 1.11  CALCIUM 9.7 7.9* 8.1*   GFR: Estimated Creatinine Clearance: 46.6 mL/min (by C-G formula based on SCr of 1.11 mg/dL). Liver Function Tests: Recent Labs  Lab 08/12/24 0939  AST 43*  ALT 36  ALKPHOS 59  BILITOT 0.9  PROT 5.9*  ALBUMIN 3.6   No results for input(s): LIPASE, AMYLASE in the last 168 hours. No results for input(s): AMMONIA in the last 168 hours. Coagulation Profile: No results for input(s): INR, PROTIME in the last 168 hours. Cardiac Enzymes: No results for input(s): CKTOTAL, CKMB, CKMBINDEX, TROPONINI in the last 168 hours. BNP (last 3 results) No results for input(s): PROBNP in the last 8760 hours. HbA1C: No results for input(s): HGBA1C in the last 72 hours. CBG: No results for input(s): GLUCAP in the last 168 hours. Lipid Profile: No results for input(s): CHOL, HDL, LDLCALC, TRIG, CHOLHDL, LDLDIRECT in  the last 72 hours. Thyroid  Function Tests: No results for input(s): TSH, T4TOTAL, FREET4, T3FREE, THYROIDAB in the last 72 hours. Anemia Panel: No results for input(s): VITAMINB12, FOLATE, FERRITIN, TIBC, IRON, RETICCTPCT in the last 72 hours. Sepsis Labs: Recent Labs  Lab 08/12/24 9060 08/12/24 1817 08/12/24 2131  LATICACIDVEN 2.4* 3.9* 1.8    Recent Results (from the past 240 hours)  Resp panel by RT-PCR (RSV, Flu A&B, Covid) Anterior Nasal Swab     Status: None   Collection Time: 08/12/24  9:01 AM   Specimen: Anterior Nasal Swab  Result Value Ref Range Status   SARS Coronavirus 2 by RT PCR NEGATIVE NEGATIVE Final    Comment: (NOTE) SARS-CoV-2 target nucleic acids are NOT DETECTED.  The SARS-CoV-2 RNA is generally detectable in upper respiratory specimens during the acute phase of infection. The lowest concentration of SARS-CoV-2 viral copies this assay can detect is 138 copies/mL. A negative result does not preclude SARS-Cov-2 infection and should not be used as the sole basis for treatment or other patient management decisions. A negative result may occur with  improper specimen collection/handling, submission of specimen other than nasopharyngeal swab, presence of viral mutation(s) within the areas targeted by this assay, and inadequate number of viral copies(<138 copies/mL). A negative result must be combined with clinical observations, patient history, and epidemiological information. The expected result is Negative.  Fact Sheet for Patients:  bloggercourse.com  Fact Sheet for Healthcare Providers:  seriousbroker.it  This test is no t yet approved or cleared by the [place]  FDA and  has been authorized for detection and/or diagnosis of SARS-CoV-2 by FDA under an Emergency Use Authorization (EUA). This EUA will remain  in effect (meaning this test can be used) for the duration of  the COVID-19 declaration under Section 564(b)(1) of the Act, 21 U.S.C.section 360bbb-3(b)(1), unless the authorization is terminated  or revoked sooner.       Influenza A by PCR NEGATIVE NEGATIVE Final   Influenza B by PCR NEGATIVE NEGATIVE Final    Comment: (NOTE) The Xpert Xpress SARS-CoV-2/FLU/RSV plus assay is intended as an aid in the diagnosis of influenza from Nasopharyngeal swab specimens and should not be used as a sole basis for treatment. Nasal washings and aspirates are unacceptable for Xpert Xpress SARS-CoV-2/FLU/RSV testing.  Fact Sheet for Patients: bloggercourse.com  Fact Sheet for Healthcare Providers: seriousbroker.it  This test is not yet approved or cleared by the [place]  FDA and has been authorized for detection and/or diagnosis of SARS-CoV-2 by FDA under an Emergency Use Authorization (EUA). This EUA will remain in effect (meaning this test can be used) for the duration of the COVID-19 declaration under Section 564(b)(1) of the Act, 21 U.S.C. section 360bbb-3(b)(1), unless the authorization is terminated or revoked.     Resp Syncytial Virus by PCR NEGATIVE NEGATIVE Final    Comment: (NOTE) Fact Sheet for Patients: bloggercourse.com  Fact  Sheet for Healthcare Providers: seriousbroker.it  This test is not yet approved or cleared by the [place]  FDA and has been authorized for detection and/or diagnosis of SARS-CoV-2 by FDA under an Emergency Use Authorization (EUA). This EUA will remain in effect (meaning this test can be used) for the duration of the COVID-19 declaration under Section 564(b)(1) of the Act, 21 U.S.C. section 360bbb-3(b)(1), unless the authorization is terminated or revoked.  Performed at [facility], [address] [place], [place] [address]   Blood Culture (routine x 2)     Status: None (Preliminary result)    Collection Time: 08/12/24  9:39 AM   Specimen: BLOOD  Result Value Ref Range Status   Specimen Description BLOOD LEFT ANTECUBITAL  Final   Special Requests   Final    BOTTLES DRAWN AEROBIC AND ANAEROBIC Blood Culture adequate volume   Culture   Final    NO GROWTH 2 DAYS Performed at [facility], [address] [place], [place] [address]    Report Status PENDING  Incomplete  Blood Culture (routine x 2)     Status: None (Preliminary result)   Collection Time: 08/12/24 10:15 AM   Specimen: BLOOD  Result Value Ref Range Status   Specimen Description BLOOD BLOOD RIGHT FOREARM  Final   Special Requests   Final    BOTTLES DRAWN AEROBIC AND ANAEROBIC Blood Culture results may not be optimal due to an inadequate volume of blood received in culture bottles   Culture   Final    NO GROWTH 2 DAYS Performed at [facility], [address] [place], [place] [address]    Report Status PENDING  Incomplete  MRSA Next Gen by PCR, Nasal     Status: None   Collection Time: 08/13/24  1:15 AM   Specimen: Nasal Mucosa; Nasal Swab  Result Value Ref Range Status   MRSA by PCR Next Gen NOT DETECTED NOT DETECTED Final    Comment: (NOTE) The GeneXpert MRSA Assay (FDA approved for NASAL specimens only), is one component of a comprehensive MRSA colonization surveillance program. It is not intended to diagnose MRSA infection nor to guide or monitor treatment for MRSA infections. Test performance is not FDA approved in patients less than 43 years old. Performed at [facility], [address], [place], [place] [address]          Radiology Studies: US  RENAL Result Date: 08/12/2024 EXAM: RETROPERITONEAL ULTRASOUND OF THE KIDNEYS 08/12/2024 03:02:59 PM TECHNIQUE: Real-time ultrasonography of the retroperitoneum, specifically the kidneys and urinary bladder, was performed. COMPARISON: None available. CLINICAL HISTORY: 409830 AKI (acute kidney injury) 409830 Acute kidney injury.  ICD [phone] Acute kidney injury. FINDINGS: RIGHT KIDNEY: Right kidney measures 10.7 x 4.6 x 4.6 cm. Normal cortical echogenicity. No hydronephrosis. No calculus. Upper pole cyst measuring 2.5 cm. No mass. LEFT KIDNEY: Left kidney measures 11 x 5.2 x 4.1 cm. Normal cortical echogenicity. No hydronephrosis. No calculus. No mass. URINARY BLADDER: Mild circumferential wall thickening of the urinary bladder with trabeculation. Post void residual of 35 ml. IMPRESSION: 1. Mild circumferential wall thickening of the urinary bladder with trabeculation, which may be due to chronic bladder outlet obstruction if there is a history of prostatomegaly or neurogenic bladder. Correlation with urinalysis recommended to exclude acute cystitis. 2. No hydronephrosis or nephrolithiasis. Electronically signed by: Rogelia Myers MD 08/12/2024 03:08 PM EST RP Workstation: [id number]        Scheduled Meds:  aspirin  EC  81 mg Oral Daily  enoxaparin  (LOVENOX ) injection  40 mg Subcutaneous Q24H   finasteride   5 mg Oral Daily   fluconazole   200 mg Oral Daily   hydrocortisone  sod succinate (SOLU-CORTEF ) inj  100 mg Intravenous Q12H   pantoprazole   40 mg Oral Daily   triamcinolone  cream   Topical BID   Continuous Infusions:  ceFEPime  (MAXIPIME ) IV 2 g (08/14/24 0016)     LOS: 2 days       Anthony CHRISTELLA Pouch, MD [facility] Pager [phone]  If 7PM-7AM, please contact night-coverage [email or web address]  08/14/2024, 10:32 AM   "

## 2024-08-14 NOTE — Progress Notes (Signed)
 Patient noted with episodes of irregular Heart rate , unsustained. Patient is asymptomatic. EKG obtained, showing  Sinus rhythm with occasional premature ventricular complexes and premature atrial complexes , non specific T wave abnormality. Oxygen 88% on room air, placed on 2 l nasal cannula with improvement to 94%. NP, Laneta BIRCH notified. Labs pending . Will notify provider with any changes. Plan of care continued.

## 2024-08-14 NOTE — Evaluation (Signed)
 Physical Therapy Evaluation Patient Details Name: Jackson Coffey MRN: [id number] DOB: 1940/10/17 Today's Date: 08/14/2024  History of Present Illness  Pt is an 84 y.o. male presented with worsening fever, chills, and fall admitted for sepsis d/t oral candida, AKI. PMH of giant cell arteritis, rheumatoid arthritis.  Clinical Impression  Pt was pleasant and motivated to participate during the session and put forth good effort throughout. Pt found on 2LO2/min with resting SpO2 84-85%.  Spoke to nursing who requested O2 be increased to </= 5L as needed for SpO2 to be >/= 88% during session.  O2 increased to 3L with resting SpO2 increasing to 87-88%.  Pt able to come to sitting at the EOB without physical assist and to perform sit to/from stand transfer with cues for hand placement only.  SpO2 noted to drop to 79-80% with this effort with pt returned to sitting and SpO2 increased to 4L.  Pt's SpO2 again increased to 88-89% at rest on 4L and pt was able to amb 50 feet with SpO2 checked frequently and mostly in the upper 80s while walking but at the end of the walk dropped to 86%.  Nursing entered room and requested O2 to 5L and stated would notify MD and attempt to find ear sensor to confirm SpO2 readings at the finger.  Pt will benefit from continued PT services upon discharge to safely address deficits listed in patient problem list for decreased caregiver assistance and eventual return to PLOF.           If plan is discharge home, recommend the following: A little help with walking and/or transfers;A little help with bathing/dressing/bathroom;Assistance with cooking/housework;Help with stairs or ramp for entrance;Assist for transportation   Can travel by private vehicle        Equipment Recommendations Rolling walker (2 wheels);BSC/3in1  Recommendations for Other Services       Functional Status Assessment Patient has had a recent decline in their functional status and demonstrates the  ability to make significant improvements in function in a reasonable and predictable amount of time.     Precautions / Restrictions Precautions Precautions: Fall Restrictions Weight Bearing Restrictions Per Provider Order: No      Mobility  Bed Mobility Overal bed mobility: Modified Independent             General bed mobility comments: Min increased time and effort but no physical assist needed    Transfers Overall transfer level: Needs assistance Equipment used: Rolling walker (2 wheels) Transfers: Sit to/from Stand Sit to Stand: Contact guard assist           General transfer comment: Min verbal cues for hand placement    Ambulation/Gait Ambulation/Gait assistance: Supervision Gait Distance (Feet): 50 Feet Assistive device: Rolling walker (2 wheels) Gait Pattern/deviations: Step-through pattern, Decreased step length - right, Decreased step length - left Gait velocity: decreased     General Gait Details: Min reduced cadence but steady with no overt LOB  Stairs            Wheelchair Mobility     Tilt Bed    Modified Rankin (Stroke Patients Only)       Balance Overall balance assessment: Needs assistance Sitting-balance support: Feet supported Sitting balance-Leahy Scale: Normal     Standing balance support: Bilateral upper extremity supported, During functional activity Standing balance-Leahy Scale: Good  Pertinent Vitals/Pain Pain Assessment Pain Assessment: 0-10 Pain Score: 3  Pain Location: chronic hip and low back pain Pain Descriptors / Indicators: Sore Pain Intervention(s): Repositioned, Monitored during session, Premedicated before session    Home Living Family/patient expects to be discharged to:: Private residence Living Arrangements: Spouse/significant other Available Help at Discharge: Family;Available 24 hours/day Type of Home: House Home Access: Stairs to enter Entrance  Stairs-Rails: Right Entrance Stairs-Number of Steps: 3   Home Layout: One level Home Equipment: Grab bars - tub/shower      Prior Function Prior Level of Function : Independent/Modified Independent;Driving             Mobility Comments: Ind amb community distances without an AD, one fall in the last 6 months ADLs Comments: indep with ADL, IADLs     Extremity/Trunk Assessment   Upper Extremity Assessment Upper Extremity Assessment: Generalized weakness    Lower Extremity Assessment Lower Extremity Assessment: Generalized weakness       Communication   Communication Communication: Impaired Factors Affecting Communication: Hearing impaired    Cognition Arousal: Alert Behavior During Therapy: WFL for tasks assessed/performed   PT - Cognitive impairments: No apparent impairments                         Following commands: Intact       Cueing       General Comments General comments (skin integrity, edema, etc.): on 2L at rest sp02 91%; dropped to 80% with mobility on 2L but improved to 91% with PLB and rest    Exercises Other Exercises Other Exercises: Pt education provided on breathing through the nose and not the mouth with B[place] in place and for PLB with SOB after exertion   Assessment/Plan    PT Assessment Patient needs continued PT services  PT Problem List Decreased strength;Decreased activity tolerance;Decreased balance;Decreased mobility;Decreased knowledge of use of DME;Pain       PT Treatment Interventions DME instruction;Gait training;Stair training;Functional mobility training;Therapeutic activities;Therapeutic exercise;Balance training;Patient/family education    PT Goals (Current goals can be found in the Care Plan section)  Acute Rehab PT Goals Patient Stated Goal: To get stronger PT Goal Formulation: With patient Time For Goal Achievement: 08/27/24 Potential to Achieve Goals: Good    Frequency Min 2X/week     Co-evaluation                AM-PAC PT 6 Clicks Mobility  Outcome Measure Help needed turning from your back to your side while in a flat bed without using bedrails?: None Help needed moving from lying on your back to sitting on the side of a flat bed without using bedrails?: None Help needed moving to and from a bed to a chair (including a wheelchair)?: A Little Help needed standing up from a chair using your arms (e.g., wheelchair or bedside chair)?: A Little Help needed to walk in hospital room?: A Little Help needed climbing 3-5 steps with a railing? : A Little 6 Click Score: 20    End of Session Equipment Utilized During Treatment: Gait belt;Oxygen Activity Tolerance: Other (comment) (limited by SpO2 desaturation with exertion and resulting SOB) Patient left: in bed;with call bell/phone within reach;with bed alarm set;with family/visitor present Nurse Communication: Mobility status;Other (comment) (Nsg in room at end of session and aware of above SpO2 results) PT Visit Diagnosis: Difficulty in walking, not elsewhere classified (R26.2);Muscle weakness (generalized) (M62.81);Pain Pain - part of body: Hip (bilateral hips and low back, chronic  per patient)    Time: 8954-8876 PT Time Calculation (min) (ACUTE ONLY): 38 min   Charges:   PT Evaluation $PT Eval Moderate Complexity: 1 Mod PT Treatments $Therapeutic Activity: 8-22 mins PT General Charges $$ ACUTE PT VISIT: 1 Visit       D. Scott Alphonza Tramell PT, DPT 08/14/24, 12:05 PM

## 2024-08-14 NOTE — Care Management Important Message (Signed)
 Important Message  Patient Details  Name: Jackson Coffey MRN: [id number] Date of Birth: [birth year]   Important Message Given:  Yes - Medicare IM     [name] 08/14/2024, 12:56 PM

## 2024-09-09 ENCOUNTER — Encounter: Payer: PPO | Admitting: Family Medicine

## 2024-12-11 ENCOUNTER — Ambulatory Visit: Admitting: Urology
